# Patient Record
Sex: Female | Born: 1990 | Race: Black or African American | Hispanic: No | Marital: Married | Smoking: Never smoker
Health system: Southern US, Community
[De-identification: ages and names within clinical notes are randomized; demographics above are authoritative.]

## PROBLEM LIST (undated history)

## (undated) DIAGNOSIS — K5900 Constipation, unspecified: Secondary | ICD-10-CM

## (undated) DIAGNOSIS — D509 Iron deficiency anemia, unspecified: Secondary | ICD-10-CM

## (undated) DIAGNOSIS — D573 Sickle-cell trait: Secondary | ICD-10-CM

## (undated) DIAGNOSIS — K589 Irritable bowel syndrome without diarrhea: Secondary | ICD-10-CM

## (undated) HISTORY — DX: Constipation, unspecified: K59.00

## (undated) HISTORY — DX: Irritable bowel syndrome without diarrhea: K58.9

## (undated) HISTORY — DX: Iron deficiency anemia, unspecified: D50.9

---

## 2011-09-22 HISTORY — PX: WISDOM TOOTH EXTRACTION: SHX21

## 2014-09-21 DIAGNOSIS — K5900 Constipation, unspecified: Secondary | ICD-10-CM

## 2014-09-21 DIAGNOSIS — K589 Irritable bowel syndrome without diarrhea: Secondary | ICD-10-CM

## 2014-09-21 HISTORY — DX: Constipation, unspecified: K59.00

## 2014-09-21 HISTORY — DX: Irritable bowel syndrome without diarrhea: K58.9

## 2018-03-22 LAB — HM PAP SMEAR: HM Pap smear: NORMAL

## 2018-05-16 ENCOUNTER — Encounter: Payer: Self-pay | Admitting: Internal Medicine

## 2018-05-16 ENCOUNTER — Ambulatory Visit: Payer: Managed Care, Other (non HMO) | Admitting: Internal Medicine

## 2018-05-16 VITALS — BP 98/60 | HR 80 | Ht 66.0 in | Wt 125.0 lb

## 2018-05-16 DIAGNOSIS — N760 Acute vaginitis: Secondary | ICD-10-CM | POA: Diagnosis not present

## 2018-05-16 DIAGNOSIS — F419 Anxiety disorder, unspecified: Secondary | ICD-10-CM | POA: Diagnosis not present

## 2018-05-16 DIAGNOSIS — F329 Major depressive disorder, single episode, unspecified: Secondary | ICD-10-CM | POA: Diagnosis not present

## 2018-05-16 LAB — POCT WET PREP WITH KOH
KOH Prep POC: NEGATIVE
RBC Wet Prep HPF POC: 0
TRICHOMONAS UA: NEGATIVE
WBC Wet Prep HPF POC: 5

## 2018-05-16 MED ORDER — METRONIDAZOLE 500 MG PO TABS: 500.0000 mg | ORAL_TABLET | Freq: Two times a day (BID) | ORAL | 0 refills | Status: AC

## 2018-05-16 NOTE — Progress Notes (Signed)
Date:  05/16/2018   Name:  Helen Schneider   DOB:  06-30-1991   MRN:  [id number]   Chief Complaint: Establish Care; Depression (PHQ9- 10. Was in therapy since 2016 in [place]. Newly moved her three months ago after getting married.); and Vaginal Discharge (X 3 days. Itching, white discharge, burning when urinating. No swelling or inflammation. )  Depression         This is a recurrent problem.The problem is unchanged.  Associated symptoms include no fatigue, no headaches and no suicidal ideas.     The symptoms are aggravated by social issues and family issues.  Past treatments include psychotherapy.  Compliance with treatment is good.  Previous treatment provided moderate relief. Vaginal Discharge  The patient's primary symptoms include genital itching and vaginal discharge. This is a new problem. The current episode started in the past 7 days. The problem occurs constantly. The problem has been unchanged. Pertinent negatives include no back pain, chills, dysuria, fever, headaches or hematuria.      Review of Systems  Constitutional: Negative for chills, fatigue and fever.  Respiratory: Negative for chest tightness, shortness of breath and wheezing.   Cardiovascular: Negative for palpitations and leg swelling.  Genitourinary: Positive for vaginal discharge. Negative for difficulty urinating, dysuria, genital sores and hematuria.  Musculoskeletal: Negative for arthralgias and back pain.  Neurological: Negative for dizziness and headaches.  Psychiatric/Behavioral: Positive for depression and dysphoric mood. Negative for self-injury, sleep disturbance and suicidal ideas. The patient is nervous/anxious.     Patient Active Problem List   Diagnosis Date Noted  . Anxiety and depression 05/16/2018    No Known Allergies  History reviewed. No pertinent surgical history.  Social History   Tobacco Use  . Smoking status: Never Smoker  . Smokeless tobacco: Never Used  Substance Use Topics  .  Alcohol use: Never    Frequency: Never  . Drug use: Never     Medication list has been reviewed and updated.  Current Meds  Medication Sig  . Ascorbic Acid (VITAMIN C) 100 MG tablet Take 100 mg by mouth daily.  . ferrous sulfate 325 (65 FE) MG EC tablet Take 325 mg by mouth 3 (three) times daily with meals.  . folic acid (FOLVITE) 1 MG tablet Take 1 mg by mouth daily.  . norethindrone-ethinyl estradiol (JUNEL FE,GILDESS FE,LOESTRIN FE) 1-20 MG-MCG tablet Take 1 tablet by mouth daily.  . Probiotic Product (PROBIOTIC PO) Take by mouth.    PHQ 2/9 Scores 05/16/2018  PHQ - 2 Score 4  PHQ- 9 Score 10    Physical Exam  Constitutional: She is oriented to person, place, and time. She appears well-developed. No distress.  HENT:  Head: Normocephalic and atraumatic.  Neck: Normal range of motion. Neck supple.  Cardiovascular: Normal rate, regular rhythm and normal heart sounds.  Pulmonary/Chest: Effort normal and breath sounds normal. No respiratory distress.  Abdominal: Soft. Bowel sounds are normal. She exhibits no mass. There is no tenderness. There is no rebound and no guarding.  Musculoskeletal: Normal range of motion.  Lymphadenopathy:    She has no cervical adenopathy.  Neurological: She is alert and oriented to person, place, and time.  Skin: Skin is warm and dry. No rash noted.  Psychiatric: She has a normal mood and affect. Her behavior is normal. Thought content normal.  Nursing note and vitals reviewed.   BP 98/60 (BP Location: Right Arm, Patient Position: Sitting, Cuff Size: Normal)   Pulse 80   Ht 5'  6" (1.676 m)   Wt 125 lb (56.7 kg)   LMP 04/28/2018 (Exact Date)   SpO2 100%   BMI 20.18 kg/m   Assessment and Plan 1. Acute vaginitis - POCT Wet Prep with KOH - metroNIDAZOLE (FLAGYL) 500 MG tablet; Take 1 tablet (500 mg total) by mouth 2 (two) times daily for 5 days.  Dispense: 14 tablet; Refill: 0  2. Anxiety and depression Several recommendations for counseling  provided   Meds ordered this encounter  Medications  . metroNIDAZOLE (FLAGYL) 500 MG tablet    Sig: Take 1 tablet (500 mg total) by mouth 2 (two) times daily for 5 days.    Dispense:  14 tablet    Refill:  0    Partially dictated using Animal nutritionistDragon software. Any errors are unintentional.  Bari EdwardLaura Venice Liz, MD [facility]  05/16/2018

## 2018-05-16 NOTE — Patient Instructions (Addendum)
Dr. Caryn SectionAarti Kapur  [phone] (psychiatrist)  Esperanza RichtersBruce Thomson - counseling psychologist [phone]  Gevena Barreara H Jones (counselor) - card given

## 2018-06-13 ENCOUNTER — Encounter: Payer: Self-pay | Admitting: Internal Medicine

## 2018-06-14 ENCOUNTER — Ambulatory Visit (INDEPENDENT_AMBULATORY_CARE_PROVIDER_SITE_OTHER): Payer: Managed Care, Other (non HMO) | Admitting: Internal Medicine

## 2018-06-14 ENCOUNTER — Encounter: Payer: Self-pay | Admitting: Internal Medicine

## 2018-06-14 VITALS — BP 102/66 | HR 76 | Ht 66.0 in | Wt 122.0 lb

## 2018-06-14 DIAGNOSIS — N6011 Diffuse cystic mastopathy of right breast: Secondary | ICD-10-CM | POA: Diagnosis not present

## 2018-06-14 DIAGNOSIS — N926 Irregular menstruation, unspecified: Secondary | ICD-10-CM

## 2018-06-14 DIAGNOSIS — Z3041 Encounter for surveillance of contraceptive pills: Secondary | ICD-10-CM

## 2018-06-14 DIAGNOSIS — N6012 Diffuse cystic mastopathy of left breast: Secondary | ICD-10-CM

## 2018-06-14 DIAGNOSIS — N898 Other specified noninflammatory disorders of vagina: Secondary | ICD-10-CM

## 2018-06-14 LAB — POCT WET PREP WITH KOH
KOH PREP POC: NEGATIVE
TRICHOMONAS UA: NEGATIVE

## 2018-06-14 LAB — POCT URINALYSIS DIPSTICK
Bilirubin, UA: NEGATIVE
GLUCOSE UA: NEGATIVE
Ketones, UA: NEGATIVE
LEUKOCYTES UA: NEGATIVE
Nitrite, UA: NEGATIVE
PH UA: 6 (ref 5.0–8.0)
Protein, UA: NEGATIVE
RBC UA: NEGATIVE
SPEC GRAV UA: 1.015 (ref 1.010–1.025)
UROBILINOGEN UA: 0.2 U/dL

## 2018-06-14 LAB — POCT URINE PREGNANCY: PREG TEST UR: NEGATIVE

## 2018-06-14 NOTE — Progress Notes (Signed)
Date:  06/14/2018   Name:  Helen Schneider   DOB:  04/07/1991   MRN:  [id number]   Chief Complaint: Breast Pain (Both sides. Under armpir pain. Had two mentrual cycles this month and pain is lasting longer than normal.) and vaginal odor (Was given Flagyl at last visit and it did help. Itching and burning is gone but still has fishy odor. )  HPI  Bilateral breast tenderness - usually with her periods but more prolonged recently.  No nipple discharge or recent injury. She has had 2 periods this month.  The first was short - 3 days then last week had 4 days of regular bleeding then spotting since then. She has a vaginal odor but no itching or abnormal discharge.  Review of Systems  Constitutional: Negative for chills, fatigue and fever.  Respiratory: Negative for cough, chest tightness, shortness of breath and wheezing.   Cardiovascular: Negative for chest pain.  Gastrointestinal: Negative for abdominal pain, blood in stool and constipation.  Genitourinary: Positive for menstrual problem.  Musculoskeletal: Negative for arthralgias and back pain.  Neurological: Negative for dizziness and headaches.  Psychiatric/Behavioral: Negative for dysphoric mood. The patient is not nervous/anxious.     Patient Active Problem List   Diagnosis Date Noted  . Anxiety and depression 05/16/2018    No Known Allergies  History reviewed. No pertinent surgical history.  Social History   Tobacco Use  . Smoking status: Never Smoker  . Smokeless tobacco: Never Used  Substance Use Topics  . Alcohol use: Never    Frequency: Never  . Drug use: Never     Medication list has been reviewed and updated.  Current Meds  Medication Sig  . Ascorbic Acid (VITAMIN C) 100 MG tablet Take 100 mg by mouth daily.  . ferrous sulfate 325 (65 FE) MG EC tablet Take 325 mg by mouth 3 (three) times daily with meals.  . folic acid (FOLVITE) 1 MG tablet Take 1 mg by mouth daily.  . norethindrone-ethinyl estradiol  (JUNEL FE,GILDESS FE,LOESTRIN FE) 1-20 MG-MCG tablet Take 1 tablet by mouth daily.  . Probiotic Product (PROBIOTIC PO) Take by mouth.    PHQ 2/9 Scores 05/16/2018  PHQ - 2 Score 4  PHQ- 9 Score 10    Physical Exam  Constitutional: She is oriented to person, place, and time. She appears well-developed. No distress.  HENT:  Head: Normocephalic and atraumatic.  Cardiovascular: Normal rate, regular rhythm and normal heart sounds.  Pulmonary/Chest: Effort normal and breath sounds normal. No respiratory distress. Right breast exhibits tenderness. Right breast exhibits no inverted nipple, no mass, no nipple discharge and no skin change. Left breast exhibits tenderness. Left breast exhibits no inverted nipple, no mass, no nipple discharge and no skin change.  Tender both outer breasts, R>L Fibrocystic changes in both lateral breasts  Abdominal: Soft. Normal appearance. There is no tenderness.  Musculoskeletal: Normal range of motion.  Neurological: She is alert and oriented to person, place, and time.  Skin: Skin is warm and dry. No rash noted.  Psychiatric: She has a normal mood and affect. Her behavior is normal. Thought content normal.  Nursing note and vitals reviewed.   BP 102/66 (BP Location: Right Arm, Patient Position: Sitting, Cuff Size: Normal)   Pulse 76   Ht 5\' 6"  (1.676 m)   Wt 122 lb (55.3 kg)   LMP 06/07/2018 (Exact Date)   SpO2 100%   BMI 19.69 kg/m   Assessment and Plan: 1. Fibrocystic breast changes of  both breasts Avoid caffeine, begin vitamin E If fullness persists after next menstrual cycle, will order US  2. Irregular menses Likely due to initiation of OCPs - POCT urine pregnancy  3. Oral contraceptive use Continue ocps daily Cycle should even out and become lighter and regular over the next 1-2 months  4. Vaginal odor No evidence of infection seen - POCT urinalysis dipstick - POCT Wet Prep with KOH   Partially dictated using Animal nutritionistDragon software. Any  errors are unintentional.  Bari EdwardLaura Lemond Griffee, MD [facility]  06/14/2018

## 2018-06-14 NOTE — Patient Instructions (Signed)
Wait one more month on birth control pills to see if cycle regulates.  Wait 1-2 weeks while monitoring breast issues - if worsening or not improved, call for breast US.  Limit caffeine and take vitamin E tablet daily; advil or tylenol if breast pain is severe

## 2018-06-15 ENCOUNTER — Ambulatory Visit: Payer: Managed Care, Other (non HMO) | Admitting: Internal Medicine

## 2018-09-07 ENCOUNTER — Ambulatory Visit (INDEPENDENT_AMBULATORY_CARE_PROVIDER_SITE_OTHER): Payer: Self-pay | Admitting: Internal Medicine

## 2018-09-07 ENCOUNTER — Encounter: Payer: Self-pay | Admitting: Internal Medicine

## 2018-09-07 VITALS — BP 112/70 | HR 75 | Ht 66.0 in | Wt 123.0 lb

## 2018-09-07 DIAGNOSIS — N949 Unspecified condition associated with female genital organs and menstrual cycle: Secondary | ICD-10-CM

## 2018-09-07 LAB — POCT URINALYSIS DIPSTICK
Bilirubin, UA: NEGATIVE
Blood, UA: NEGATIVE
GLUCOSE UA: NEGATIVE
KETONES UA: NEGATIVE
Leukocytes, UA: NEGATIVE
NITRITE UA: NEGATIVE
Protein, UA: NEGATIVE
SPEC GRAV UA: 1.02 (ref 1.010–1.025)
Urobilinogen, UA: 0.2 E.U./dL
pH, UA: 6 (ref 5.0–8.0)

## 2018-09-07 LAB — POCT WET PREP WITH KOH
Clue Cells Wet Prep HPF POC: POSITIVE
KOH PREP POC: NEGATIVE
Trichomonas, UA: NEGATIVE
WBC WET PREP PER HPF POC: 10

## 2018-09-07 MED ORDER — METRONIDAZOLE 0.75 % VA GEL: 1.0000 | Freq: Two times a day (BID) | VAGINAL | 0 refills | Status: DC

## 2018-09-07 NOTE — Progress Notes (Signed)
Date:  09/07/2018   Name:  Helen Schneider   DOB:  [birth year]   MRN:  [id number]   Chief Complaint: Vaginal Itching (Started last Friday. Itching stopped two days ago. When having intercourse with husband vagina is burning. Burning when urination, and strong fishy odor.  )  Vaginal Itching  The patient's primary symptoms include genital itching, a genital odor and vaginal bleeding. The patient's pertinent negatives include no pelvic pain or vaginal discharge. The current episode started in the past 7 days. The problem occurs constantly. The problem has been unchanged. The pain is mild. She is not pregnant. Associated symptoms include dysuria. Pertinent negatives include no abdominal pain, chills, diarrhea, fever, headaches, hematuria or nausea. She is sexually active. No, her partner does not have an STD. She uses oral contraceptives for contraception.    Review of Systems  Constitutional: Negative for chills, fatigue and fever.  Respiratory: Negative for chest tightness and shortness of breath.   Cardiovascular: Negative for chest pain and palpitations.  Gastrointestinal: Negative for abdominal pain, diarrhea and nausea.  Genitourinary: Positive for dysuria and menstrual problem (menses have been longer but lighter since starting OCPs). Negative for genital sores, hematuria, pelvic pain and vaginal discharge.  Neurological: Negative for dizziness and headaches.    Patient Active Problem List   Diagnosis Date Noted  . Anxiety and depression 05/16/2018    No Known Allergies  History reviewed. No pertinent surgical history.  Social History   Tobacco Use  . Smoking status: Never Smoker  . Smokeless tobacco: Never Used  Substance Use Topics  . Alcohol use: Never    Frequency: Never  . Drug use: Never     Medication list has been reviewed and updated.  Current Meds  Medication Sig  . Ascorbic Acid (VITAMIN C) 100 MG tablet Take 100 mg by mouth daily.  . ferrous sulfate 325  (65 FE) MG EC tablet Take 325 mg by mouth 3 (three) times daily with meals.  . folic acid (FOLVITE) 1 MG tablet Take 1 mg by mouth daily.  . norethindrone-ethinyl estradiol (JUNEL FE,GILDESS FE,LOESTRIN FE) 1-20 MG-MCG tablet Take 1 tablet by mouth daily.  . Probiotic Product (PROBIOTIC PO) Take by mouth.    PHQ 2/9 Scores 05/16/2018  PHQ - 2 Score 4  PHQ- 9 Score 10    Physical Exam Vitals signs and nursing note reviewed.  Constitutional:      General: She is not in acute distress.    Appearance: She is well-developed.  HENT:     Head: Normocephalic and atraumatic.  Cardiovascular:     Rate and Rhythm: Normal rate and regular rhythm.  Pulmonary:     Effort: Pulmonary effort is normal. No respiratory distress.     Breath sounds: Normal breath sounds.  Abdominal:     General: Abdomen is flat. Bowel sounds are normal.     Palpations: Abdomen is soft.     Tenderness: There is no abdominal tenderness.  Musculoskeletal: Normal range of motion.  Skin:    General: Skin is warm and dry.     Findings: No rash.  Neurological:     Mental Status: She is alert and oriented to person, place, and time.  Psychiatric:        Behavior: Behavior normal.        Thought Content: Thought content normal.     BP 112/70 (BP Location: Right Arm, Patient Position: Sitting, Cuff Size: Normal)   Pulse 75   Ht 5\' 6"  (  1.676 m)   Wt 123 lb (55.8 kg)   SpO2 100%   BMI 19.85 kg/m   Assessment and Plan: 1. Vaginal burning - POCT Urinalysis Dipstick - POCT Wet Prep with KOH - metroNIDAZOLE (METROGEL) 0.75 % vaginal gel; Place 1 Applicatorful vaginally 2 (two) times daily for 5 days.  Dispense: 100 g; Refill: 0   Partially dictated using Animal nutritionistDragon software. Any errors are unintentional.  Bari EdwardLaura Colinda Barth, MD [facility]  09/07/2018

## 2018-09-23 ENCOUNTER — Other Ambulatory Visit: Payer: Self-pay | Admitting: Internal Medicine

## 2018-09-23 DIAGNOSIS — N949 Unspecified condition associated with female genital organs and menstrual cycle: Secondary | ICD-10-CM

## 2018-09-23 MED ORDER — METRONIDAZOLE 0.75 % VA GEL: 1.0000 | Freq: Two times a day (BID) | VAGINAL | 0 refills | Status: AC

## 2018-10-10 ENCOUNTER — Ambulatory Visit: Payer: Self-pay | Admitting: Internal Medicine

## 2018-10-12 ENCOUNTER — Other Ambulatory Visit: Payer: Self-pay

## 2018-10-12 ENCOUNTER — Ambulatory Visit: Payer: BC Managed Care – PPO | Admitting: Internal Medicine

## 2018-10-12 ENCOUNTER — Encounter: Payer: Self-pay | Admitting: Internal Medicine

## 2018-10-12 VITALS — BP 118/70 | HR 77 | Ht 66.0 in | Wt 122.0 lb

## 2018-10-12 DIAGNOSIS — Z789 Other specified health status: Secondary | ICD-10-CM | POA: Diagnosis not present

## 2018-10-12 MED ORDER — NORETHIN ACE-ETH ESTRAD-FE 1-20 MG-MCG(24) PO TABS: 1.0000 | ORAL_TABLET | Freq: Every day | ORAL | 11 refills | Status: DC

## 2018-10-12 MED ORDER — NORETHIN ACE-ETH ESTRAD-FE 1-20 MG-MCG PO TABS: 1.0000 | ORAL_TABLET | Freq: Every day | ORAL | 5 refills | Status: DC

## 2018-10-12 MED ORDER — NORETHIN-ETH ESTRAD-FE BIPHAS 1 MG-10 MCG / 10 MCG PO TABS: 1.0000 | ORAL_TABLET | Freq: Every day | ORAL | 11 refills | Status: DC

## 2018-10-12 NOTE — Progress Notes (Signed)
    Date:  10/12/2018   Name:  Helen Schneider   DOB:  09-Aug-1991   MRN:  [id number]   Chief Complaint: No chief complaint on file. Contraception - has been on Lo Loestrin for 6 months and doing well but needs a refill.  New Rx was sent but it is not covered and would require a PA.   Menses are regular, no headaches, nausea, lower extremity edema. Pas was normal last year. She has no concerns for STIs. HPI  Review of Systems  Constitutional: Negative for chills, fatigue and fever.  Respiratory: Negative for chest tightness and shortness of breath.   Cardiovascular: Negative for chest pain and leg swelling.  Genitourinary: Negative for menstrual problem.  Neurological: Negative for dizziness and headaches.    Patient Active Problem List   Diagnosis Date Noted  . Anxiety and depression 05/16/2018    No Known Allergies  History reviewed. No pertinent surgical history.  Social History   Tobacco Use  . Smoking status: Never Smoker  . Smokeless tobacco: Never Used  Substance Use Topics  . Alcohol use: Never    Frequency: Never  . Drug use: Never     Medication list has been reviewed and updated.  Current Meds  Medication Sig  . Ascorbic Acid (VITAMIN C) 100 MG tablet Take 100 mg by mouth daily.  . ferrous sulfate 325 (65 FE) MG EC tablet Take 325 mg by mouth 3 (three) times daily with meals.  . folic acid (FOLVITE) 1 MG tablet Take 1 mg by mouth daily.  . Probiotic Product (PROBIOTIC PO) Take by mouth.  . [DISCONTINUED] norethindrone-ethinyl estradiol (JUNEL FE,GILDESS FE,LOESTRIN FE) 1-20 MG-MCG tablet Take 1 tablet by mouth daily.    PHQ 2/9 Scores 05/16/2018  PHQ - 2 Score 4  PHQ- 9 Score 10    Physical Exam Vitals signs and nursing note reviewed.  Constitutional:      General: She is not in acute distress.    Appearance: She is well-developed.  HENT:     Head: Normocephalic and atraumatic.  Neck:     Musculoskeletal: Normal range of motion.  Cardiovascular:     Rate and Rhythm: Normal rate and regular rhythm.  Pulmonary:     Effort: Pulmonary effort is normal. No respiratory distress.     Breath sounds: Normal breath sounds.  Musculoskeletal: Normal range of motion.     Right lower leg: No edema.     Left lower leg: No edema.  Skin:    General: Skin is warm and dry.     Findings: No rash.  Neurological:     Mental Status: She is alert and oriented to person, place, and time.  Psychiatric:        Behavior: Behavior normal.        Thought Content: Thought content normal.     BP 118/70   Pulse 77   Ht 5\' 6"  (1.676 m)   Wt 122 lb (55.3 kg)   SpO2 100%   BMI 19.69 kg/m   Assessment and Plan: 1. Uses oral contraceptives as primary birth control method Lo Loestrin not covered - will change to Loestrin Fe generic - Norethindrone Acetate-Ethinyl Estrad-FE (LOESTRIN 24 FE) 1-20 MG-MCG(24) tablet; Take 1 tablet by mouth daily.  Dispense: 1 Package; Refill: 11   Partially dictated using Animal nutritionist. Any errors are unintentional.  Bari Edward, MD [facility]  10/12/2018

## 2019-05-10 ENCOUNTER — Ambulatory Visit: Payer: BC Managed Care – PPO | Admitting: Internal Medicine

## 2019-05-17 ENCOUNTER — Ambulatory Visit (INDEPENDENT_AMBULATORY_CARE_PROVIDER_SITE_OTHER): Payer: BC Managed Care – PPO | Admitting: Internal Medicine

## 2019-05-17 ENCOUNTER — Encounter: Payer: Self-pay | Admitting: Internal Medicine

## 2019-05-17 ENCOUNTER — Other Ambulatory Visit: Payer: Self-pay

## 2019-05-17 VITALS — BP 99/60 | HR 88 | Ht 66.0 in | Wt 121.0 lb

## 2019-05-17 DIAGNOSIS — R42 Dizziness and giddiness: Secondary | ICD-10-CM | POA: Diagnosis not present

## 2019-05-17 MED ORDER — MECLIZINE HCL 12.5 MG PO TABS: 12.5000 mg | ORAL_TABLET | Freq: Three times a day (TID) | ORAL | 0 refills | Status: DC | PRN

## 2019-05-17 NOTE — Progress Notes (Signed)
Date:  05/17/2019   Name:  Helen Schneider   DOB:  Jan 29, 1991   MRN:  [id number]   Chief Complaint: Dizziness (Last week was having dizzy spells all day. Have not had any today but still had it yesterday. Had vertigo in the past. )  Dizziness This is a new problem. The current episode started in the past 7 days. The problem occurs daily. The problem has been gradually improving. Pertinent negatives include no coughing, diaphoresis, fever, headaches, nausea, sore throat, visual change, vomiting or weakness.    Review of Systems  Constitutional: Negative for diaphoresis and fever.  HENT: Positive for ear pain (left sided pain resolved). Negative for sore throat.   Eyes: Negative for visual disturbance.  Respiratory: Negative for cough, chest tightness and shortness of breath.   Cardiovascular: Negative for palpitations and leg swelling.  Gastrointestinal: Negative for nausea and vomiting.  Allergic/Immunologic: Negative for environmental allergies.  Neurological: Positive for dizziness. Negative for syncope, weakness and headaches.    Patient Active Problem List   Diagnosis Date Noted  . Anxiety and depression 05/16/2018    No Known Allergies  History reviewed. No pertinent surgical history.  Social History   Tobacco Use  . Smoking status: Never Smoker  . Smokeless tobacco: Never Used  Substance Use Topics  . Alcohol use: Never    Frequency: Never  . Drug use: Never     Medication list has been reviewed and updated.  Current Meds  Medication Sig  . Ascorbic Acid (VITAMIN C) 100 MG tablet Take 100 mg by mouth daily.  . ferrous sulfate 325 (65 FE) MG EC tablet Take 325 mg by mouth 3 (three) times daily with meals.  . folic acid (FOLVITE) 1 MG tablet Take 1 mg by mouth daily.  . Norethindrone Acetate-Ethinyl Estrad-FE (LOESTRIN 24 FE) 1-20 MG-MCG(24) tablet Take 1 tablet by mouth daily.  . Probiotic Product (PROBIOTIC PO) Take by mouth.    PHQ 2/9 Scores 05/17/2019  05/17/2019 05/16/2018  PHQ - 2 Score 1 1 4   PHQ- 9 Score 1 - 10    BP Readings from Last 3 Encounters:  05/17/19 99/60  10/12/18 118/70  09/07/18 112/70    Physical Exam Vitals signs and nursing note reviewed.  Constitutional:      General: She is not in acute distress.    Appearance: Normal appearance. She is well-developed.  HENT:     Head: Normocephalic and atraumatic.     Right Ear: Tympanic membrane and ear canal normal.     Left Ear: Tympanic membrane and ear canal normal.  Eyes:     General: Lids are normal.     Extraocular Movements:     Right eye: Nystagmus present.     Left eye: Nystagmus present.     Conjunctiva/sclera: Conjunctivae normal.  Neck:     Musculoskeletal: Normal range of motion.  Cardiovascular:     Rate and Rhythm: Normal rate and regular rhythm.  Pulmonary:     Effort: Pulmonary effort is normal. No respiratory distress.     Breath sounds: No wheezing or rhonchi.  Musculoskeletal: Normal range of motion.  Lymphadenopathy:     Cervical: No cervical adenopathy.  Skin:    General: Skin is warm and dry.     Findings: No rash.  Neurological:     Mental Status: She is alert and oriented to person, place, and time.     Sensory: Sensation is intact.     Motor: Motor function is intact.  Coordination: Coordination is intact.     Gait: Gait is intact.  Psychiatric:        Behavior: Behavior normal.        Thought Content: Thought content normal.     Wt Readings from Last 3 Encounters:  05/17/19 121 lb (54.9 kg)  10/12/18 122 lb (55.3 kg)  09/07/18 123 lb (55.8 kg)    BP 99/60   Pulse 88   Ht 5\' 6"  (1.676 m)   Wt 121 lb (54.9 kg)   LMP 04/28/2019 (Exact Date)   SpO2 99%   BMI 19.53 kg/m   Assessment and Plan: 1. Vertigo Probably secondary to recent URI and ear congestion Expect sx to gradually decrease then stop over the next few days May use meclizine if sx are severe and pt does not need to drive - meclizine (ANTIVERT) 12.5 MG  tablet; Take 1 tablet (12.5 mg total) by mouth 3 (three) times daily as needed for dizziness.  Dispense: 20 tablet; Refill: 0   Partially dictated using Animal nutritionistDragon software. Any errors are unintentional.  Bari EdwardLaura Sotirios Navarro, MD [facility]  05/17/2019

## 2019-07-06 ENCOUNTER — Ambulatory Visit (INDEPENDENT_AMBULATORY_CARE_PROVIDER_SITE_OTHER): Payer: BC Managed Care – PPO | Admitting: Internal Medicine

## 2019-07-06 ENCOUNTER — Encounter: Payer: Self-pay | Admitting: Internal Medicine

## 2019-07-06 VITALS — Temp 97.2°F | Ht 66.0 in | Wt 121.0 lb

## 2019-07-06 DIAGNOSIS — J019 Acute sinusitis, unspecified: Secondary | ICD-10-CM | POA: Diagnosis not present

## 2019-07-06 MED ORDER — AMOXICILLIN-POT CLAVULANATE 875-125 MG PO TABS: 1.0000 | ORAL_TABLET | Freq: Two times a day (BID) | ORAL | 0 refills | Status: AC

## 2019-07-06 NOTE — Progress Notes (Signed)
Date:  07/06/2019   Name:  Helen Schneider   DOB:  November 24, 1990   MRN:  [id number]  I connected with this patient, Helen Schneider Resides, by telephone at the patient's work.  I verified that I am speaking with the correct person using two identifiers. This visit was conducted via telephone due to the Covid-19 outbreak from my office at [facility] in [place], [place]. I discussed the limitations, risks, security and privacy concerns of performing an evaluation and management service by telephone. I also discussed with the patient that there may be a patient responsible charge related to this service. The patient expressed understanding and agreed to proceed.  Chief Complaint: Sinusitis (Started Monday afternoon. Scratchy throat, swollen lymph nodes in neck, sinus pressure. Coughing with yellow production. No fever. Pressure around nose, and cheek bones. )  Sinusitis This is a new problem. The current episode started in the past 7 days. The problem has been gradually worsening since onset. There has been no fever. The pain is mild. Associated symptoms include congestion, coughing, headaches, sinus pressure, sneezing and a sore throat. Pertinent negatives include no chills or shortness of breath. Treatments tried: mucinex. The treatment provided no relief.    Review of Systems  Constitutional: Negative for chills, fatigue and fever.  HENT: Positive for congestion, postnasal drip, sinus pressure, sneezing and sore throat. Negative for tinnitus and trouble swallowing.   Respiratory: Positive for cough. Negative for chest tightness, shortness of breath and wheezing.   Cardiovascular: Negative for chest pain and palpitations.  Gastrointestinal: Negative for diarrhea and vomiting.  Skin: Negative for rash.  Allergic/Immunologic: Negative for environmental allergies.  Neurological: Positive for headaches. Negative for dizziness.    Patient Active Problem List   Diagnosis Date Noted  . Anxiety and  depression 05/16/2018    No Known Allergies  History reviewed. No pertinent surgical history.  Social History   Tobacco Use  . Smoking status: Never Smoker  . Smokeless tobacco: Never Used  Substance Use Topics  . Alcohol use: Never    Frequency: Never  . Drug use: Never     Medication list has been reviewed and updated.  Current Meds  Medication Sig  . Ascorbic Acid (VITAMIN C) 100 MG tablet Take 100 mg by mouth daily.  . ferrous sulfate 325 (65 FE) MG EC tablet Take 325 mg by mouth 3 (three) times daily with meals.  . folic acid (FOLVITE) 1 MG tablet Take 1 mg by mouth daily.  . meclizine (ANTIVERT) 12.5 MG tablet Take 1 tablet (12.5 mg total) by mouth 3 (three) times daily as needed for dizziness.  . Probiotic Product (PROBIOTIC PO) Take by mouth.  . [DISCONTINUED] Norethindrone Acetate-Ethinyl Estrad-FE (LOESTRIN 24 FE) 1-20 MG-MCG(24) tablet Take 1 tablet by mouth daily.    PHQ 2/9 Scores 07/06/2019 05/17/2019 05/17/2019 05/16/2018  PHQ - 2 Score 0 1 1 4   PHQ- 9 Score 0 1 - 10    BP Readings from Last 3 Encounters:  05/17/19 99/60  10/12/18 118/70  09/07/18 112/70    Physical Exam Pulmonary:     Comments: No cough or dyspnea noted during the call Neurological:     Mental Status: She is alert.  Psychiatric:        Attention and Perception: Attention normal.        Mood and Affect: Mood normal.        Speech: Speech normal.        Cognition and Memory: Cognition normal.  Wt Readings from Last 3 Encounters:  07/06/19 121 lb (54.9 kg)  05/17/19 121 lb (54.9 kg)  10/12/18 122 lb (55.3 kg)    Temp (!) 97.2 F (36.2 C) (Oral)   Ht 5\' 6"  (1.676 m)   Wt 121 lb (54.9 kg)   BMI 19.53 kg/m   Assessment and Plan: 1. Acute non-recurrent sinusitis, unspecified location Stop mucinex  Begin Claritin-D Proceed with Covid test previously arranged - amoxicillin-clavulanate (AUGMENTIN) 875-125 MG tablet; Take 1 tablet by mouth 2 (two) times daily for 10 days.   Dispense: 20 tablet; Refill: 0  I spent 6 minutes on this encounter. Partially dictated using . Any errors are unintentional.  Animal nutritionist, MD [facility]  07/06/2019

## 2019-07-19 ENCOUNTER — Other Ambulatory Visit: Payer: Self-pay | Admitting: Internal Medicine

## 2019-07-19 DIAGNOSIS — B3731 Acute candidiasis of vulva and vagina: Secondary | ICD-10-CM

## 2019-07-19 DIAGNOSIS — B373 Candidiasis of vulva and vagina: Secondary | ICD-10-CM

## 2019-07-19 MED ORDER — FLUCONAZOLE 100 MG PO TABS: 100.0000 mg | ORAL_TABLET | Freq: Once | ORAL | 0 refills | Status: AC

## 2019-09-19 ENCOUNTER — Encounter: Payer: Self-pay | Admitting: Internal Medicine

## 2019-09-19 ENCOUNTER — Ambulatory Visit: Payer: BC Managed Care – PPO | Admitting: Internal Medicine

## 2019-09-19 ENCOUNTER — Other Ambulatory Visit: Payer: Self-pay

## 2019-09-19 VITALS — BP 116/77 | HR 81 | Resp 16 | Ht 66.0 in | Wt 121.0 lb

## 2019-09-19 DIAGNOSIS — N912 Amenorrhea, unspecified: Secondary | ICD-10-CM

## 2019-09-19 LAB — POCT URINE PREGNANCY: Preg Test, Ur: POSITIVE — AB

## 2019-09-19 NOTE — Patient Instructions (Addendum)
[facility]  [facility]  Folic acid 1 mg per day  Tylenol, Sudafed, Claritin Stay away from combination cold/sinus medications For heartburn - Rolaids, Tums, liquid antiacids - avoid Pepcid and Prilosec  Estimated due date Sept 3, 2020  Commonly Asked Questions During Pregnancy  Cats: A parasite can be excreted in cat feces.  To avoid exposure you need to have another person empty the little box.  If you must empty the litter box you will need to wear gloves.  Wash your hands after handling your cat.  This parasite can also be found in raw or undercooked meat so this should also be avoided.  Colds, Sore Throats, Flu: Please check your medication sheet to see what you can take for symptoms.  If your symptoms are unrelieved by these medications please call the office.  Dental Work: Most any dental work Investment banker, corporate recommends is permitted.  X-rays should only be taken during the first trimester if absolutely necessary.  Your abdomen should be shielded with a lead apron during all x-rays.  Please notify your provider prior to receiving any x-rays.  Novocaine is fine; gas is not recommended.  If your dentist requires a note from [place] prior to dental work please call the office and we will provide one for you.  Exercise: Exercise is an important part of staying healthy during your pregnancy.  You may continue most exercises you were accustomed to prior to pregnancy.  Later in your pregnancy you will most likely notice you have difficulty with activities requiring balance like riding a bicycle.  It is important that you listen to your body and avoid activities that put you at a higher risk of falling.  Adequate rest and staying well hydrated are a must!  If you have questions about the safety of specific activities ask your provider.    Exposure to Children with illness: Try to avoid obvious exposure; report any symptoms to [place] when noted,  If you have chicken pos, red measles or mumps,  you should be immune to these diseases.   Please do not take any vaccines while pregnant unless you have checked with your OB provider.  Fetal Movement: After 28 weeks we recommend you do "kick counts" twice daily.  Lie or sit down in a calm quiet environment and count your baby movements "kicks".  You should feel your baby at least 10 times per hour.  If you have not felt 10 kicks within the first hour get up, walk around and have something sweet to eat or drink then repeat for an additional hour.  If count remains less than 10 per hour notify your provider.  Fumigating: Follow your pest control agent's advice as to how long to stay out of your home.  Ventilate the area well before re-entering.  Hemorrhoids:   Most over-the-counter preparations can be used during pregnancy.  Check your medication to see what is safe to use.  It is important to use a stool softener or fiber in your diet and to drink lots of liquids.  If hemorrhoids seem to be getting worse please call the office.   Hot Tubs:  Hot tubs Jacuzzis and saunas are not recommended while pregnant.  These increase your internal body temperature and should be avoided.  Intercourse:  Sexual intercourse is safe during pregnancy as long as you are comfortable, unless otherwise advised by your provider.  Spotting may occur after intercourse; report any bright red bleeding that is heavier than spotting.  Labor:  If you know that you are in labor, please go to the hospital.  If you are unsure, please call the office and let us help you decide what to do.  Lifting, straining, etc:  If your job requires heavy lifting or straining please check with your provider for any limitations.  Generally, you should not lift items heavier than that you can lift simply with your hands and arms (no back muscles)  Painting:  Paint fumes do not harm your pregnancy, but may make you ill and should be avoided if possible.  Latex or water based paints have less odor  than oils.  Use adequate ventilation while painting.  Permanents & Hair Color:  Chemicals in hair dyes are not recommended as they cause increase hair dryness which can increase hair loss during pregnancy.  " Highlighting" and permanents are allowed.  Dye may be absorbed differently and permanents may not hold as well during pregnancy.  Sunbathing:  Use a sunscreen, as skin burns easily during pregnancy.  Drink plenty of fluids; avoid over heating.  Tanning Beds:  Because their possible side effects are still unknown, tanning beds are not recommended.  Ultrasound Scans:  Routine ultrasounds are performed at approximately 20 weeks.  You will be able to see your baby's general anatomy an if you would like to know the gender this can usually be determined as well.  If it is questionable when you conceived you may also receive an ultrasound early in your pregnancy for dating purposes.  Otherwise ultrasound exams are not routinely performed unless there is a medical necessity.  Although you can request a scan we ask that you pay for it when conducted because insurance does not cover " patient request" scans.  Work: If your pregnancy proceeds without complications you may work until your due date, unless your physician or employer advises otherwise.  Round Ligament Pain/Pelvic Discomfort:  Sharp, shooting pains not associated with bleeding are fairly common, usually occurring in the second trimester of pregnancy.  They tend to be worse when standing up or when you remain standing for long periods of time.  These are the result of pressure of certain pelvic ligaments called "round ligaments".  Rest, Tylenol and heat seem to be the most effective relief.  As the womb and fetus grow, they rise out of the pelvis and the discomfort improves.  Please notify the office if your pain seems different than that described.  It may represent a more serious condition.

## 2019-09-19 NOTE — Progress Notes (Signed)
Date:  09/19/2019   Name:  Helen Schneider   DOB:  08-10-1991   MRN:  [id number]   Chief Complaint: Possible Pregnancy (LMP 08/20/2019) Patient has been trying to conceive since October.  Two days ago she felt some mild abdominal cramping but had no bleeding or other pain.  She has done three home pregnancy tests since then - all positive. She has had no more cramping and no bleeding.  She feels well, no nausea or breast tenderness. She was previously taking folic acid but ran out and has not restarted. She does not have a preference of OB.  HPI  No results found for: CREATININE, BUN, NA, K, CL, CO2 No results found for: CHOL, HDL, LDLCALC, LDLDIRECT, TRIG, CHOLHDL No results found for: TSH No results found for: HGBA1C   Review of Systems  Constitutional: Negative for chills, fatigue and fever.  Respiratory: Negative for chest tightness and shortness of breath.   Cardiovascular: Negative for chest pain, palpitations and leg swelling.  Gastrointestinal: Negative for abdominal pain, constipation, diarrhea, nausea and vomiting.  Genitourinary: Negative for dysuria and pelvic pain.  Neurological: Positive for headaches (occasional headached). Negative for dizziness and weakness.  Hematological: Negative for adenopathy.    Patient Active Problem List   Diagnosis Date Noted  . Anxiety and depression 05/16/2018    No Known Allergies  History reviewed. No pertinent surgical history.  Social History   Tobacco Use  . Smoking status: Never Smoker  . Smokeless tobacco: Never Used  Substance Use Topics  . Alcohol use: Never  . Drug use: Never     Medication list has been reviewed and updated.  Current Meds  Medication Sig  . Ascorbic Acid (VITAMIN C) 100 MG tablet Take 100 mg by mouth daily.  . ferrous sulfate 325 (65 FE) MG EC tablet Take 325 mg by mouth 3 (three) times daily with meals.  . folic acid (FOLVITE) 1 MG tablet Take 1 mg by mouth daily.  . meclizine (ANTIVERT)  12.5 MG tablet Take 1 tablet (12.5 mg total) by mouth 3 (three) times daily as needed for dizziness.  . Probiotic Product (PROBIOTIC PO) Take by mouth.    PHQ 2/9 Scores 07/06/2019 05/17/2019 05/17/2019 05/16/2018  PHQ - 2 Score 0 1 1 4   PHQ- 9 Score 0 1 - 10    BP Readings from Last 3 Encounters:  09/19/19 116/77  05/17/19 99/60  10/12/18 118/70    Physical Exam Vitals and nursing note reviewed.  Constitutional:      General: She is not in acute distress.    Appearance: Normal appearance. She is well-developed.  HENT:     Head: Normocephalic and atraumatic.  Cardiovascular:     Rate and Rhythm: Normal rate and regular rhythm.     Pulses: Normal pulses.  Pulmonary:     Effort: Pulmonary effort is normal. No respiratory distress.     Breath sounds: No wheezing or rhonchi.  Musculoskeletal:     Cervical back: Normal range of motion.     Right lower leg: No edema.     Left lower leg: No edema.  Lymphadenopathy:     Cervical: No cervical adenopathy.  Skin:    General: Skin is warm and dry.     Capillary Refill: Capillary refill takes less than 2 seconds.     Findings: No rash.  Neurological:     General: No focal deficit present.     Mental Status: She is alert and oriented to  person, place, and time.  Psychiatric:        Behavior: Behavior normal.        Thought Content: Thought content normal.     Wt Readings from Last 3 Encounters:  09/19/19 121 lb (54.9 kg)  07/06/19 121 lb (54.9 kg)  05/17/19 121 lb (54.9 kg)    BP 116/77   Pulse 81   Resp 16   Ht 5\' 6"  (1.676 m)   Wt 121 lb (54.9 kg)   LMP 08/20/2019 (Exact Date)   SpO2 100%   BMI 19.53 kg/m   Assessment and Plan: 1. Amenorrhea Secondary to pregnancy Patient to begin Folic acid 1 mg per day - or Prenatal vitamin daily Precautions given regarding over the counter medications, alcohol and tobacco use and caffeine intake. Establish with OB of choice for ongoing care - POCT urine  pregnancy   Partially dictated using Dragon software. Any errors are unintentional.  Halina Maidens, MD [facility]  09/19/2019  .

## 2019-09-21 ENCOUNTER — Telehealth: Payer: Self-pay

## 2019-09-21 NOTE — Telephone Encounter (Signed)
Received fax from [employer] for [place] to complete a PA and fill out form for pt to get a free breast pump. Called and informed patient she would get this completed from OBGYN once she is a littler farther along. She verbalized understanding.  Benedict Needy, CMA

## 2019-09-26 ENCOUNTER — Telehealth: Payer: Self-pay | Admitting: Obstetrics and Gynecology

## 2019-09-26 NOTE — Telephone Encounter (Signed)
Pt called. Pt stated that she noticed light spotting once yesterday and have not seen it since. Pt stated also having light cramping. Pt was advised to take tylenol for the pain and if she noticed heavy bleeding like a cycle to call the office or go to the ED. Pt stated that she understood.

## 2019-09-26 NOTE — Telephone Encounter (Signed)
Pt called in and stated that her PCP conformed her preg. On 12/29. Pt wants a call from the nurse she has spotting. Please advise

## 2019-10-20 NOTE — Patient Instructions (Signed)
First Trimester of Pregnancy The first trimester of pregnancy is from week 1 until the end of week 13 (months 1 through 3). A week after a sperm fertilizes an egg, the egg will implant on the wall of the uterus. This embryo will begin to develop into a baby. Genes from you and your partner will form the baby. The female genes will determine whether the baby will be a boy or a girl. At 6-8 weeks, the eyes and face will be formed, and the heartbeat can be seen on ultrasound. At the end of 12 weeks, all the baby's organs will be formed. Now that you are pregnant, you will want to do everything you can to have a healthy baby. Two of the most important things are to get good prenatal care and to follow your health care provider's instructions. Prenatal care is all the medical care you receive before the baby's birth. This care will help prevent, find, and treat any problems during the pregnancy and childbirth. Body changes during your first trimester Your body goes through many changes during pregnancy. The changes vary from woman to woman.  You may gain or lose a couple of pounds at first.  You may feel sick to your stomach (nauseous) and you may throw up (vomit). If the vomiting is uncontrollable, call your health care provider.  You may tire easily.  You may develop headaches that can be relieved by medicines. All medicines should be approved by your health care provider.  You may urinate more often. Painful urination may mean you have a bladder infection.  You may develop heartburn as a result of your pregnancy.  You may develop constipation because certain hormones are causing the muscles that push stool through your intestines to slow down.  You may develop hemorrhoids or swollen veins (varicose veins).  Your breasts may begin to grow larger and become tender. Your nipples may stick out more, and the tissue that surrounds them (areola) may become darker.  Your gums may bleed and may be  sensitive to brushing and flossing.  Dark spots or blotches (chloasma, mask of pregnancy) may develop on your face. This will likely fade after the baby is born.  Your menstrual periods will stop.  You may have a loss of appetite.  You may develop cravings for certain kinds of food.  You may have changes in your emotions from day to day, such as being excited to be pregnant or being concerned that something may go wrong with the pregnancy and baby.  You may have more vivid and strange dreams.  You may have changes in your hair. These can include thickening of your hair, rapid growth, and changes in texture. Some women also have hair loss during or after pregnancy, or hair that feels dry or thin. Your hair will most likely return to normal after your baby is born. What to expect at prenatal visits During a routine prenatal visit:  You will be weighed to make sure you and the baby are growing normally.  Your blood pressure will be taken.  Your abdomen will be measured to track your baby's growth.  The fetal heartbeat will be listened to between weeks 10 and 14 of your pregnancy.  Test results from any previous visits will be discussed. Your health care provider may ask you:  How you are feeling.  If you are feeling the baby move.  If you have had any abnormal symptoms, such as leaking fluid, bleeding, severe headaches, or abdominal   cramping.  If you are using any tobacco products, including cigarettes, chewing tobacco, and electronic cigarettes.  If you have any questions. Other tests that may be performed during your first trimester include:  Blood tests to find your blood type and to check for the presence of any previous infections. The tests will also be used to check for low iron levels (anemia) and protein on red blood cells (Rh antibodies). Depending on your risk factors, or if you previously had diabetes during pregnancy, you may have tests to check for high blood sugar  that affects pregnant women (gestational diabetes).  Urine tests to check for infections, diabetes, or protein in the urine.  An ultrasound to confirm the proper growth and development of the baby.  Fetal screens for spinal cord problems (spina bifida) and Down syndrome.  HIV (human immunodeficiency virus) testing. Routine prenatal testing includes screening for HIV, unless you choose not to have this test.  You may need other tests to make sure you and the baby are doing well. Follow these instructions at home: Medicines  Follow your health care provider's instructions regarding medicine use. Specific medicines may be either safe or unsafe to take during pregnancy.  Take a prenatal vitamin that contains at least 600 micrograms (mcg) of folic acid.  If you develop constipation, try taking a stool softener if your health care provider approves. Eating and drinking   Eat a balanced diet that includes fresh fruits and vegetables, whole grains, good sources of protein such as meat, eggs, or tofu, and low-fat dairy. Your health care provider will help you determine the amount of weight gain that is right for you.  Avoid raw meat and uncooked cheese. These carry germs that can cause birth defects in the baby.  Eating four or five small meals rather than three large meals a day may help relieve nausea and vomiting. If you start to feel nauseous, eating a few soda crackers can be helpful. Drinking liquids between meals, instead of during meals, also seems to help ease nausea and vomiting.  Limit foods that are high in fat and processed sugars, such as fried and sweet foods.  To prevent constipation: ? Eat foods that are high in fiber, such as fresh fruits and vegetables, whole grains, and beans. ? Drink enough fluid to keep your urine clear or pale yellow. Activity  Exercise only as directed by your health care provider. Most women can continue their usual exercise routine during  pregnancy. Try to exercise for 30 minutes at least 5 days a week. Exercising will help you: ? Control your weight. ? Stay in shape. ? Be prepared for labor and delivery.  Experiencing pain or cramping in the lower abdomen or lower back is a good sign that you should stop exercising. Check with your health care provider before continuing with normal exercises.  Try to avoid standing for long periods of time. Move your legs often if you must stand in one place for a long time.  Avoid heavy lifting.  Wear low-heeled shoes and practice good posture.  You may continue to have sex unless your health care provider tells you not to. Relieving pain and discomfort  Wear a good support bra to relieve breast tenderness.  Take warm sitz baths to soothe any pain or discomfort caused by hemorrhoids. Use hemorrhoid cream if your health care provider approves.  Rest with your legs elevated if you have leg cramps or low back pain.  If you develop varicose veins in   your legs, wear support hose. Elevate your feet for 15 minutes, 3-4 times a day. Limit salt in your diet. Prenatal care  Schedule your prenatal visits by the twelfth week of pregnancy. They are usually scheduled monthly at first, then more often in the last 2 months before delivery.  Write down your questions. Take them to your prenatal visits.  Keep all your prenatal visits as told by your health care provider. This is important. Safety  Wear your seat belt at all times when driving.  Make a list of emergency phone numbers, including numbers for family, friends, the hospital, and police and fire departments. General instructions  Ask your health care provider for a referral to a local prenatal education class. Begin classes no later than the beginning of month 6 of your pregnancy.  Ask for help if you have counseling or nutritional needs during pregnancy. Your health care provider can offer advice or refer you to specialists for help  with various needs.  Do not use hot tubs, steam rooms, or saunas.  Do not douche or use tampons or scented sanitary pads.  Do not cross your legs for long periods of time.  Avoid cat litter boxes and soil used by cats. These carry germs that can cause birth defects in the baby and possibly loss of the fetus by miscarriage or stillbirth.  Avoid all smoking, herbs, alcohol, and medicines not prescribed by your health care provider. Chemicals in these products affect the formation and growth of the baby.  Do not use any products that contain nicotine or tobacco, such as cigarettes and e-cigarettes. If you need help quitting, ask your health care provider. You may receive counseling support and other resources to help you quit.  Schedule a dentist appointment. At home, brush your teeth with a soft toothbrush and be gentle when you floss. Contact a health care provider if:  You have dizziness.  You have mild pelvic cramps, pelvic pressure, or nagging pain in the abdominal area.  You have persistent nausea, vomiting, or diarrhea.  You have a bad smelling vaginal discharge.  You have pain when you urinate.  You notice increased swelling in your face, hands, legs, or ankles.  You are exposed to fifth disease or chickenpox.  You are exposed to German measles (rubella) and have never had it. Get help right away if:  You have a fever.  You are leaking fluid from your vagina.  You have spotting or bleeding from your vagina.  You have severe abdominal cramping or pain.  You have rapid weight gain or loss.  You vomit blood or material that looks like coffee grounds.  You develop a severe headache.  You have shortness of breath.  You have any kind of trauma, such as from a fall or a car accident. Summary  The first trimester of pregnancy is from week 1 until the end of week 13 (months 1 through 3).  Your body goes through many changes during pregnancy. The changes vary from  woman to woman.  You will have routine prenatal visits. During those visits, your health care provider will examine you, discuss any test results you may have, and talk with you about how you are feeling. This information is not intended to replace advice given to you by your health care provider. Make sure you discuss any questions you have with your health care provider. Document Revised: 08/20/2017 Document Reviewed: 08/19/2016 Elsevier Patient Education  2020 [employer]  Commonly Asked Questions During Pregnancy    Cats: A parasite can be excreted in cat feces.  To avoid exposure you need to have another person empty the little box.  If you must empty the litter box you will need to wear gloves.  Wash your hands after handling your cat.  This parasite can also be found in raw or undercooked meat so this should also be avoided.  Colds, Sore Throats, Flu: Please check your medication sheet to see what you can take for symptoms.  If your symptoms are unrelieved by these medications please call the office.  Dental Work: Most any dental work your dentist recommends is permitted.  X-rays should only be taken during the first trimester if absolutely necessary.  Your abdomen should be shielded with a lead apron during all x-rays.  Please notify your provider prior to receiving any x-rays.  Novocaine is fine; gas is not recommended.  If your dentist requires a note from us prior to dental work please call the office and we will provide one for you.  Exercise: Exercise is an important part of staying healthy during your pregnancy.  You may continue most exercises you were accustomed to prior to pregnancy.  Later in your pregnancy you will most likely notice you have difficulty with activities requiring balance like riding a bicycle.  It is important that you listen to your body and avoid activities that put you at a higher risk of falling.  Adequate rest and staying well hydrated are a must!  If you have  questions about the safety of specific activities ask your provider.    Exposure to Children with illness: Try to avoid obvious exposure; report any symptoms to us when noted,  If you have chicken pos, red measles or mumps, you should be immune to these diseases.   Please do not take any vaccines while pregnant unless you have checked with your OB provider.  Fetal Movement: After 28 weeks we recommend you do "kick counts" twice daily.  Lie or sit down in a calm quiet environment and count your baby movements "kicks".  You should feel your baby at least 10 times per hour.  If you have not felt 10 kicks within the first hour get up, walk around and have something sweet to eat or drink then repeat for an additional hour.  If count remains less than 10 per hour notify your provider.  Fumigating: Follow your pest control agent's advice as to how long to stay out of your home.  Ventilate the area well before re-entering.  Hemorrhoids:   Most over-the-counter preparations can be used during pregnancy.  Check your medication to see what is safe to use.  It is important to use a stool softener or fiber in your diet and to drink lots of liquids.  If hemorrhoids seem to be getting worse please call the office.   Hot Tubs:  Hot tubs Jacuzzis and saunas are not recommended while pregnant.  These increase your internal body temperature and should be avoided.  Intercourse:  Sexual intercourse is safe during pregnancy as long as you are comfortable, unless otherwise advised by your provider.  Spotting may occur after intercourse; report any bright red bleeding that is heavier than spotting.  Labor:  If you know that you are in labor, please go to the hospital.  If you are unsure, please call the office and let us help you decide what to do.  Lifting, straining, etc:  If your job requires heavy lifting or straining please check with   your provider for any limitations.  Generally, you should not lift items heavier than  that you can lift simply with your hands and arms (no back muscles)  Painting:  Paint fumes do not harm your pregnancy, but may make you ill and should be avoided if possible.  Latex or water based paints have less odor than oils.  Use adequate ventilation while painting.  Permanents & Hair Color:  Chemicals in hair dyes are not recommended as they cause increase hair dryness which can increase hair loss during pregnancy.  " Highlighting" and permanents are allowed.  Dye may be absorbed differently and permanents may not hold as well during pregnancy.  Sunbathing:  Use a sunscreen, as skin burns easily during pregnancy.  Drink plenty of fluids; avoid over heating.  Tanning Beds:  Because their possible side effects are still unknown, tanning beds are not recommended.  Ultrasound Scans:  Routine ultrasounds are performed at approximately 20 weeks.  You will be able to see your baby's general anatomy an if you would like to know the gender this can usually be determined as well.  If it is questionable when you conceived you may also receive an ultrasound early in your pregnancy for dating purposes.  Otherwise ultrasound exams are not routinely performed unless there is a medical necessity.  Although you can request a scan we ask that you pay for it when conducted because insurance does not cover " patient request" scans.  Work: If your pregnancy proceeds without complications you may work until your due date, unless your physician or employer advises otherwise.  Round Ligament Pain/Pelvic Discomfort:  Sharp, shooting pains not associated with bleeding are fairly common, usually occurring in the second trimester of pregnancy.  They tend to be worse when standing up or when you remain standing for long periods of time.  These are the result of pressure of certain pelvic ligaments called "round ligaments".  Rest, Tylenol and heat seem to be the most effective relief.  As the womb and fetus grow, they rise  out of the pelvis and the discomfort improves.  Please notify the office if your pain seems different than that described.  It may represent a more serious condition.   How a Baby Grows During Pregnancy  Pregnancy begins when a female's sperm enters a female's egg (fertilization). Fertilization usually happens in one of the tubes (fallopian tubes) that connect the ovaries to the womb (uterus). The fertilized egg moves down the fallopian tube to the uterus. Once it reaches the uterus, it implants into the lining of the uterus and begins to grow. For the first 10 weeks, the fertilized egg is called an embryo. After 10 weeks, it is called a fetus. As the fetus continues to grow, it receives oxygen and nutrients through tissue (placenta) that grows to support the developing baby. The placenta is the life support system for the baby. It provides oxygen and nutrition and removes waste. Learning as much as you can about your pregnancy and how your baby is developing can help you enjoy the experience. It can also make you aware of when there might be a problem and when to ask questions. How long does a typical pregnancy last? A pregnancy usually lasts 280 days, or about 40 weeks. Pregnancy is divided into three periods of growth, also called trimesters:  First trimester: 0-12 weeks.  Second trimester: 13-27 weeks.  Third trimester: 28-40 weeks. The day when your baby is ready to be born (full term) is   your estimated date of delivery. How does my baby develop month by month? First month  The fertilized egg attaches to the inside of the uterus.  Some cells will form the placenta. Others will form the fetus.  The arms, legs, brain, spinal cord, lungs, and heart begin to develop.  At the end of the first month, the heart begins to beat. Second month  The bones, inner ear, eyelids, hands, and feet form.  The genitals develop.  By the end of 8 weeks, all major organs are developing. Third  month  All of the internal organs are forming.  Teeth develop below the gums.  Bones and muscles begin to grow. The spine can flex.  The skin is transparent.  Fingernails and toenails begin to form.  Arms and legs continue to grow longer, and hands and feet develop.  The fetus is about 3 inches (7.6 cm) long. Fourth month  The placenta is completely formed.  The external sex organs, neck, outer ear, eyebrows, eyelids, and fingernails are formed.  The fetus can hear, swallow, and move its arms and legs.  The kidneys begin to produce urine.  The skin is covered with a white, waxy coating (vernix) and very fine hair (lanugo). Fifth month  The fetus moves around more and can be felt for the first time (quickening).  The fetus starts to sleep and wake up and may begin to suck its finger.  The nails grow to the end of the fingers.  The organ in the digestive system that makes bile (gallbladder) functions and helps to digest nutrients.  If your baby is a girl, eggs are present in her ovaries. If your baby is a boy, testicles start to move down into his scrotum. Sixth month  The lungs are formed.  The eyes open. The brain continues to develop.  Your baby has fingerprints and toe prints. Your baby's hair grows thicker.  At the end of the second trimester, the fetus is about 9 inches (22.9 cm) long. Seventh month  The fetus kicks and stretches.  The eyes are developed enough to sense changes in light.  The hands can make a grasping motion.  The fetus responds to sound. Eighth month  All organs and body systems are fully developed and functioning.  Bones harden, and taste buds develop. The fetus may hiccup.  Certain areas of the brain are still developing. The skull remains soft. Ninth month  The fetus gains about  lb (0.23 kg) each week.  The lungs are fully developed.  Patterns of sleep develop.  The fetus's head typically moves into a head-down position  (vertex) in the uterus to prepare for birth.  The fetus weighs 6-9 lb (2.72-4.08 kg) and is 19-20 inches (48.26-50.8 cm) long. What can I do to have a healthy pregnancy and help my baby develop? General instructions  Take prenatal vitamins as directed by your health care provider. These include vitamins such as folic acid, iron, calcium, and vitamin D. They are important for healthy development.  Take medicines only as directed by your health care provider. Read labels and ask a pharmacist or your health care provider whether over-the-counter medicines, supplements, and prescription drugs are safe to take during pregnancy.  Keep all follow-up visits as directed by your health care provider. This is important. Follow-up visits include prenatal care and screening tests. How do I know if my baby is developing well? At each prenatal visit, your health care provider will do several different tests to   check on your health and keep track of your baby's development. These include:  Fundal height and position. ? Your health care provider will measure your growing belly from your pubic bone to the top of the uterus using a tape measure. ? Your health care provider will also feel your belly to determine your baby's position.  Heartbeat. ? An ultrasound in the first trimester can confirm pregnancy and show a heartbeat, depending on how far along you are. ? Your health care provider will check your baby's heart rate at every prenatal visit.  Second trimester ultrasound. ? This ultrasound checks your baby's development. It also may show your baby's gender. What should I do if I have concerns about my baby's development? Always talk with your health care provider about any concerns that you may have about your pregnancy and your baby. Summary  A pregnancy usually lasts 280 days, or about 40 weeks. Pregnancy is divided into three periods of growth, also called trimesters.  Your health care provider  will monitor your baby's growth and development throughout your pregnancy.  Follow your health care provider's recommendations about taking prenatal vitamins and medicines during your pregnancy.  Talk with your health care provider if you have any concerns about your pregnancy or your developing baby. This information is not intended to replace advice given to you by your health care provider. Make sure you discuss any questions you have with your health care provider. Document Revised: 12/29/2018 Document Reviewed: 07/21/2017 Elsevier Patient Education  2020 [employer]  

## 2019-10-23 ENCOUNTER — Other Ambulatory Visit: Payer: Self-pay

## 2019-10-23 ENCOUNTER — Ambulatory Visit (INDEPENDENT_AMBULATORY_CARE_PROVIDER_SITE_OTHER): Payer: Self-pay | Admitting: Obstetrics and Gynecology

## 2019-10-23 VITALS — BP 100/69 | HR 80 | Ht 66.0 in | Wt 122.4 lb

## 2019-10-23 DIAGNOSIS — Z113 Encounter for screening for infections with a predominantly sexual mode of transmission: Secondary | ICD-10-CM

## 2019-10-23 DIAGNOSIS — Z0283 Encounter for blood-alcohol and blood-drug test: Secondary | ICD-10-CM

## 2019-10-23 DIAGNOSIS — Z3491 Encounter for supervision of normal pregnancy, unspecified, first trimester: Secondary | ICD-10-CM

## 2019-10-23 NOTE — Progress Notes (Signed)
Ahleah Furney presents for NOB nurse interview visit.  Pregnancy confirmation done at [facility].  G-1.  P-0.  LMP 08/20/2019.  EDD 05/26/2020. Dating scan not done.  Pregnancy education material explained and given.  No cats in the home.  NOB labs ordered.  TSH/HgA1C not ordered.  Body mass index is 19.76 kg/m.  Sickle cell ordered. HIV and drug screen were explained and ordered.  PNV encouraged.  Genetic screening options discussed.  Genetic testing: Unsure.  FMLA form signed and financial policy reviewed.  RTC for NOB physical in 2 weeks with Dr. Valentino Saxon.

## 2019-10-24 ENCOUNTER — Other Ambulatory Visit: Payer: Self-pay

## 2019-10-24 ENCOUNTER — Ambulatory Visit (INDEPENDENT_AMBULATORY_CARE_PROVIDER_SITE_OTHER): Payer: Self-pay

## 2019-10-24 DIAGNOSIS — O3680X0 Pregnancy with inconclusive fetal viability, not applicable or unspecified: Secondary | ICD-10-CM

## 2019-10-24 DIAGNOSIS — Z3A09 9 weeks gestation of pregnancy: Secondary | ICD-10-CM

## 2019-10-24 LAB — HIV ANTIBODY (ROUTINE TESTING W REFLEX): HIV Screen 4th Generation wRfx: NONREACTIVE

## 2019-10-24 LAB — URINALYSIS, ROUTINE W REFLEX MICROSCOPIC
Bilirubin, UA: NEGATIVE
Glucose, UA: NEGATIVE
Ketones, UA: NEGATIVE
Leukocytes,UA: NEGATIVE
Nitrite, UA: NEGATIVE
Protein,UA: NEGATIVE
RBC, UA: NEGATIVE
Specific Gravity, UA: 1.018 (ref 1.005–1.030)
Urobilinogen, Ur: 0.2 mg/dL (ref 0.2–1.0)
pH, UA: 6 (ref 5.0–7.5)

## 2019-10-24 LAB — ABO AND RH: Rh Factor: POSITIVE

## 2019-10-24 LAB — ANTIBODY SCREEN: Antibody Screen: NEGATIVE

## 2019-10-24 LAB — RUBELLA SCREEN: Rubella Antibodies, IGG: 12.2 index (ref >0.99)

## 2019-10-24 LAB — VARICELLA ZOSTER ANTIBODY, IGG: Varicella zoster IgG: >4000 index (ref >165)

## 2019-10-24 LAB — RPR: RPR Ser Ql: NONREACTIVE

## 2019-10-24 LAB — GC/CHLAMYDIA PROBE AMP
Chlamydia trachomatis, NAA: NEGATIVE
Neisseria Gonorrhoeae by PCR: NEGATIVE

## 2019-10-24 LAB — SICKLE CELL SCREEN: Sickle Cell Screen: POSITIVE — AB

## 2019-10-24 LAB — HEPATITIS B SURFACE ANTIGEN: Hepatitis B Surface Ag: NEGATIVE

## 2019-10-25 LAB — NICOTINE SCREEN, URINE: Cotinine Ql Scrn, Ur: NEGATIVE ng/mL

## 2019-10-25 LAB — MONITOR DRUG PROFILE 14(MW)
Amphetamine Scrn, Ur: NEGATIVE ng/mL
BARBITURATE SCREEN URINE: NEGATIVE ng/mL
BENZODIAZEPINE SCREEN, URINE: NEGATIVE ng/mL
Buprenorphine, Urine: NEGATIVE ng/mL
CANNABINOIDS UR QL SCN: NEGATIVE ng/mL
Cocaine (Metab) Scrn, Ur: NEGATIVE ng/mL
Creatinine(Crt), U: 186.2 mg/dL (ref 20.0–300.0)
Fentanyl, Urine: NEGATIVE pg/mL
Meperidine Screen, Urine: NEGATIVE ng/mL
Methadone Screen, Urine: NEGATIVE ng/mL
OXYCODONE+OXYMORPHONE UR QL SCN: NEGATIVE ng/mL
Opiate Scrn, Ur: NEGATIVE ng/mL
Ph of Urine: 5.9 (ref 4.5–8.9)
Phencyclidine Qn, Ur: NEGATIVE ng/mL
Propoxyphene Scrn, Ur: NEGATIVE ng/mL
SPECIFIC GRAVITY: 1.017
Tramadol Screen, Urine: NEGATIVE ng/mL

## 2019-10-25 LAB — URINE CULTURE, OB REFLEX

## 2019-10-25 LAB — CULTURE, OB URINE

## 2019-10-25 NOTE — Progress Notes (Signed)
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  It is recommended that her partner be tested if he is unaware of his status.

## 2019-10-27 ENCOUNTER — Telehealth: Payer: Self-pay

## 2019-10-27 NOTE — Telephone Encounter (Signed)
Duplicate message sent in error.

## 2019-10-27 NOTE — Telephone Encounter (Signed)
Helen Schneider woke up with a bad nose bleed. Patient is 9 weeks and 4 days pregnant. She wanted to know if she should come in for a visit for these nose bleeds she is experiencing. I informed the patient that you would get back to her at your earliest convenience.  Thank you, Ardelle Lesches

## 2019-10-27 NOTE — Telephone Encounter (Deleted)
Good afternoon,  Helen Schneider called and stated that she was experiencing heavy nose bleeds. She woke up with morning with one. She is [redacted] weeks pregnant. She wants to know if she needs to make an appointment to be seen for this issue she is having. I told her that you would contact her back at your earliest convenience.   -Ardelle Lesches

## 2019-10-27 NOTE — Telephone Encounter (Signed)
Called and spoke with patient.  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

## 2019-10-27 NOTE — Telephone Encounter (Signed)
Called and spoke with patient.  Patient c/o nose bleed today.  Advised patient next time this happens to firmly pinch the soft part of nose, sit or stand upright and apply an ice pack.  Patient also aware that nose bleeds are common in pregnancy but if symptoms persist or get worse to let us know and patient verbalized understanding.

## 2019-11-07 ENCOUNTER — Telehealth: Payer: Self-pay

## 2019-11-07 NOTE — Telephone Encounter (Signed)
OB Pt 11 2/7   Pt would like to take a stool softener? Pls advise.

## 2019-11-07 NOTE — Telephone Encounter (Signed)
Called and spoke with patient.  Advised that it is ok to take stool softener and encouraged her to look in folder that was given during nurse intake for common medications safe in pregnancy.  Patient verbalized understanding.

## 2019-11-08 ENCOUNTER — Other Ambulatory Visit: Payer: Self-pay

## 2019-11-08 ENCOUNTER — Ambulatory Visit (INDEPENDENT_AMBULATORY_CARE_PROVIDER_SITE_OTHER): Payer: Self-pay | Admitting: Obstetrics and Gynecology

## 2019-11-08 ENCOUNTER — Encounter: Payer: Self-pay | Admitting: Obstetrics and Gynecology

## 2019-11-08 VITALS — BP 100/65 | HR 98 | Wt 124.1 lb

## 2019-11-08 DIAGNOSIS — D573 Sickle-cell trait: Secondary | ICD-10-CM | POA: Insufficient documentation

## 2019-11-08 DIAGNOSIS — Z3A11 11 weeks gestation of pregnancy: Secondary | ICD-10-CM

## 2019-11-08 DIAGNOSIS — Z3401 Encounter for supervision of normal first pregnancy, first trimester: Secondary | ICD-10-CM

## 2019-11-08 DIAGNOSIS — O26891 Other specified pregnancy related conditions, first trimester: Secondary | ICD-10-CM

## 2019-11-08 DIAGNOSIS — R519 Headache, unspecified: Secondary | ICD-10-CM

## 2019-11-08 LAB — POCT URINALYSIS DIPSTICK OB
Bilirubin, UA: NEGATIVE
Blood, UA: NEGATIVE
Glucose, UA: NEGATIVE
Ketones, UA: NEGATIVE
Leukocytes, UA: NEGATIVE
Nitrite, UA: NEGATIVE
POC,PROTEIN,UA: NEGATIVE
Spec Grav, UA: 1.01 (ref 1.010–1.025)
Urobilinogen, UA: 0.2 E.U./dL
pH, UA: 7.5 (ref 5.0–8.0)

## 2019-11-08 NOTE — Progress Notes (Signed)
ROB-Present for NOB PE pt is currently [redacted]w[redacted]d G1P0. Pt desires Avelina Laine genetic testing. [facility] completed today.

## 2019-11-08 NOTE — Progress Notes (Signed)
OBSTETRIC INITIAL PRENATAL VISIT  Subjective:    Helen Schneider is being seen today for her first obstetrical visit.  This is not a planned pregnancy. She is a 29 y.o. G1P0 female at [redacted]w[redacted]d gestation, Estimated Date of Delivery: 05/27/20 with Patient's last menstrual period was 08/20/2019 (exact date), consistent with 9 week sono. Her obstetrical history is significant for none. Relationship with FOB: spouse, living together. Patient does intend to breast feed. Pregnancy history fully reviewed.    OB History  Gravida Para Term Preterm AB Living  1 0 0 0 0 0  SAB TAB Ectopic Multiple Live Births  0 0 0 0 0    # Outcome Date GA Lbr Len/2nd Weight Sex Delivery Anes PTL Lv  1 Current             Gynecologic History:  Last pap smear was 2019 (performed in [place]).  Results were normal.  Denies h/o abnormal pap smears in the past.  Denies history of STIs.  Contraception: Was on OCPs, discontinued in October 2020.   Past Medical History:  Diagnosis Date  . Constipation 2016  . IBS (irritable bowel syndrome) 2016  . Iron deficiency anemia     Family History  Problem Relation Age of Onset  . Multiple sclerosis Father   . Lung cancer Maternal Grandfather   . Breast cancer Neg Hx   . Ovarian cancer Neg Hx   . Colon cancer Neg Hx     Past Surgical History:  Procedure Laterality Date  . WISDOM TOOTH EXTRACTION  2013    Social History   Socioeconomic History  . Marital status: Married    Spouse name: Not on file  . Number of children: 0  . Years of education: Not on file  . Highest education level: Not on file  Occupational History  . Not on file  Tobacco Use  . Smoking status: Never Smoker  . Smokeless tobacco: Never Used  Substance and Sexual Activity  . Alcohol use: Never  . Drug use: Never  . Sexual activity: Yes    Birth control/protection: None  Other Topics Concern  . Not on file  Social History Narrative  . Not on file   Social Determinants of Health    Financial Resource Strain:   . Difficulty of Paying Living Expenses: Not on file  Food Insecurity:   . Worried About Programme researcher, broadcasting/film/video in the Last Year: Not on file  . Ran Out of Food in the Last Year: Not on file  Transportation Needs:   . Lack of Transportation (Medical): Not on file  . Lack of Transportation (Non-Medical): Not on file  Physical Activity:   . Days of Exercise per Week: Not on file  . Minutes of Exercise per Session: Not on file  Stress:   . Feeling of Stress : Not on file  Social Connections:   . Frequency of Communication with Friends and Family: Not on file  . Frequency of Social Gatherings with Friends and Family: Not on file  . Attends Religious Services: Not on file  . Active Member of Clubs or Organizations: Not on file  . Attends Banker Meetings: Not on file  . Marital Status: Not on file  Intimate Partner Violence:   . Fear of Current or Ex-Partner: Not on file  . Emotionally Abused: Not on file  . Physically Abused: Not on file  . Sexually Abused: Not on file    Current Outpatient Medications on File  Prior to Visit  Medication Sig Dispense Refill  . Ascorbic Acid (VITAMIN C) 100 MG tablet Take 100 mg by mouth daily.    . ferrous sulfate 325 (65 FE) MG EC tablet Take 325 mg by mouth 3 (three) times daily with meals.    . Prenatal Vit-Fe Fumarate-FA (MULTIVITAMIN-PRENATAL) 27-0.8 MG TABS tablet Take 1 tablet by mouth daily at 12 noon.    . Probiotic Product (PROBIOTIC PO) Take by mouth.     No current facility-administered medications on file prior to visit.    No Known Allergies   Review of Systems General: Not Present- Fever, Weight Loss and Weight Gain. Skin: Not Present- Rash. HEENT: Not Present- Blurred Vision, Headache and Bleeding Gums. Respiratory: Not Present- Difficulty Breathing. Breast: Not Present- Breast Mass. Cardiovascular: Not Present- Chest Pain, Elevated Blood Pressure, Fainting / Blacking Out and Shortness  of Breath. Gastrointestinal: Not Present- Abdominal Pain, Constipation, Nausea and Vomiting. Female Genitourinary: Not Present- Frequency, Painful Urination, Pelvic Pain, Vaginal Bleeding, Vaginal Discharge, Contractions, regular, Fetal Movements Decreased, Urinary Complaints and Vaginal Fluid. Musculoskeletal: Not Present- Back Pain and Leg Cramps. Neurological: Not Present- Dizziness. Psychiatric: Not Present- Depression.     Objective:   Blood pressure 100/65, pulse 98, weight 124 lb 1.6 oz (56.3 kg), last menstrual period 08/20/2019.  Body mass index is 20.03 kg/m.   General Appearance:    Alert, cooperative, no distress, appears stated age  Head:    Normocephalic, without obvious abnormality, atraumatic  Eyes:    PERRL, conjunctiva/corneas clear, EOM's intact, both eyes  Ears:    Normal external ear canals, both ears  Nose:   Nares normal, septum midline, mucosa normal, no drainage or sinus tenderness  Throat:   Lips, mucosa, and tongue normal; teeth and gums normal  Neck:   Supple, symmetrical, trachea midline, no adenopathy; thyroid: no enlargement/tenderness/nodules; no carotid bruit or JVD  Back:     Symmetric, no curvature, ROM normal, no CVA tenderness  Lungs:     Clear to auscultation bilaterally, respirations unlabored  Chest Wall:    No tenderness or deformity   Heart:    Regular rate and rhythm, S1 and S2 normal, no murmur, rub or gallop  Breast Exam:    No tenderness, masses, or nipple abnormality  Abdomen:     Soft, non-tender, bowel sounds active all four quadrants, no masses, no organomegaly.  FH 11.  FHT 165 bpm.  Genitalia:    Pelvic:external genitalia normal, vagina without lesions, discharge, or tenderness, rectovaginal septum  normal. Cervix normal in appearance, no cervical motion tenderness, no adnexal masses or tenderness.  Pregnancy positive findings: uterine enlargement: 11 wk size, nontender.   Rectal:    Normal external sphincter.  No hemorrhoids appreciated.  Internal exam not done.   Extremities:   Extremities normal, atraumatic, no cyanosis or edema  Pulses:   2+ and symmetric all extremities  Skin:   Skin color, texture, turgor normal, no rashes or lesions  Lymph nodes:   Cervical, supraclavicular, and axillary nodes normal  Neurologic:   CNII-XII intact, normal strength, sensation and reflexes throughout      Assessment:   1. Encounter for supervision of normal first pregnancy in first trimester   2. Sickle cell trait (HCC)   3. Pregnancy headache in first trimester     Plan:   1. Supervision of normal pregnancy  -  Initial labs reviewed. - Prenatal vitamins encouraged. - Problem list reviewed and updated. - New OB counseling:  The patient has  been given an overview regarding routine prenatal care.  Recommendations regarding diet, weight gain, and exercise in pregnancy were given. - Prenatal testing, optional genetic testing, and ultrasound use in pregnancy were reviewed.  Panorama: ordered. - Benefits of Breast Feeding were discussed. The patient is encouraged to consider nursing her baby post partum.  2. Sickle cell trait - Patient reports that her husband was tested at PCP office, was negative. No further concerns for current or future pregnancies at this time.   3. Headaches - Patient with headaches, notes Tylenol ES does not really help. Discussed cool compresses to the head, can take occasional Excedrin if needed for intense headaches, or can prescribe medication.   The patient has Medicaid.  [facility] Medicaid Risk Screening Form completed today   Follow up in 4 weeks.  50% of 30 min visit spent on counseling and coordination of care.    Rubie Maid, MD [facility]

## 2019-11-08 NOTE — Patient Instructions (Addendum)
Second Trimester of Pregnancy  The second trimester is from week 14 through week 27 (month 4 through 6). This is often the time in pregnancy that you feel your best. Often times, morning sickness has lessened or quit. You may have more energy, and you may get hungry more often. Your unborn baby is growing rapidly. At the end of the sixth month, he or she is about 9 inches long and weighs about 1 pounds. You will likely feel the baby move between 18 and 20 weeks of pregnancy. Follow these instructions at home: Medicines  Take over-the-counter and prescription medicines only as told by your doctor. Some medicines are safe and some medicines are not safe during pregnancy.  Take a prenatal vitamin that contains at least 600 micrograms (mcg) of folic acid.  If you have trouble pooping (constipation), take medicine that will make your stool soft (stool softener) if your doctor approves. Eating and drinking   Eat regular, healthy meals.  Avoid raw meat and uncooked cheese.  If you get low calcium from the food you eat, talk to your doctor about taking a daily calcium supplement.  Avoid foods that are high in fat and sugars, such as fried and sweet foods.  If you feel sick to your stomach (nauseous) or throw up (vomit): ? Eat 4 or 5 small meals a day instead of 3 large meals. ? Try eating a few soda crackers. ? Drink liquids between meals instead of during meals.  To prevent constipation: ? Eat foods that are high in fiber, like fresh fruits and vegetables, whole grains, and beans. ? Drink enough fluids to keep your pee (urine) clear or pale yellow. Activity  Exercise only as told by your doctor. Stop exercising if you start to have cramps.  Do not exercise if it is too hot, too humid, or if you are in a place of great height (high altitude).  Avoid heavy lifting.  Wear low-heeled shoes. Sit and stand up straight.  You can continue to have sex unless your doctor tells you not  to. Relieving pain and discomfort  Wear a good support bra if your breasts are tender.  Take warm water baths (sitz baths) to soothe pain or discomfort caused by hemorrhoids. Use hemorrhoid cream if your doctor approves.  Rest with your legs raised if you have leg cramps or low back pain.  If you develop puffy, bulging veins (varicose veins) in your legs: ? Wear support hose or compression stockings as told by your doctor. ? Raise (elevate) your feet for 15 minutes, 3-4 times a day. ? Limit salt in your food. Prenatal care  Write down your questions. Take them to your prenatal visits.  Keep all your prenatal visits as told by your doctor. This is important. Safety  Wear your seat belt when driving.  Make a list of emergency phone numbers, including numbers for family, friends, the hospital, and police and fire departments. General instructions  Ask your doctor about the right foods to eat or for help finding a counselor, if you need these services.  Ask your doctor about local prenatal classes. Begin classes before month 6 of your pregnancy.  Do not use hot tubs, steam rooms, or saunas.  Do not douche or use tampons or scented sanitary pads.  Do not cross your legs for long periods of time.  Visit your dentist if you have not done so. Use a soft toothbrush to brush your teeth. Floss gently.  Avoid all smoking, herbs,   and alcohol. Avoid drugs that are not approved by your doctor.  Do not use any products that contain nicotine or tobacco, such as cigarettes and e-cigarettes. If you need help quitting, ask your doctor.  Avoid cat litter boxes and soil used by cats. These carry germs that can cause birth defects in the baby and can cause a loss of your baby (miscarriage) or stillbirth. Contact a doctor if:  You have mild cramps or pressure in your lower belly.  You have pain when you pee (urinate).  You have bad smelling fluid coming from your vagina.  You continue to  feel sick to your stomach (nauseous), throw up (vomit), or have watery poop (diarrhea).  You have a nagging pain in your belly area.  You feel dizzy. Get help right away if:  You have a fever.  You are leaking fluid from your vagina.  You have spotting or bleeding from your vagina.  You have severe belly cramping or pain.  You lose or gain weight rapidly.  You have trouble catching your breath and have chest pain.  You notice sudden or extreme puffiness (swelling) of your face, hands, ankles, feet, or legs.  You have not felt the baby move in over an hour.  You have severe headaches that do not go away when you take medicine.  You have trouble seeing. Summary  The second trimester is from week 14 through week 27 (months 4 through 6). This is often the time in pregnancy that you feel your best.  To take care of yourself and your unborn baby, you will need to eat healthy meals, take medicines only if your doctor tells you to do so, and do activities that are safe for you and your baby.  Call your doctor if you get sick or if you notice anything unusual about your pregnancy. Also, call your doctor if you need help with the right food to eat, or if you want to know what activities are safe for you. This information is not intended to replace advice given to you by your health care provider. Make sure you discuss any questions you have with your health care provider. Document Revised: 12/30/2018 Document Reviewed: 10/13/2016 Elsevier Patient Education  2020 [employer] Common Medications Safe in Pregnancy  Acne:      Constipation:  Benzoyl Peroxide     Colace  Clindamycin      Dulcolax Suppository  Topica Erythromycin     Fibercon  Salicylic Acid      Metamucil         Miralax AVOID:        Senakot   Accutane    Cough:  Retin-A       Cough Drops  Tetracycline      Phenergan w/ Codeine if Rx  Minocycline      Robitussin (Plain &  DM)  Antibiotics:     Crabs/Lice:  Ceclor       RID  Cephalosporins    AVOID:  E-Mycins      Kwell  Keflex  Macrobid/Macrodantin   Diarrhea:  Penicillin      Kao-Pectate  Zithromax      Imodium AD         PUSH FLUIDS AVOID:       Cipro     Fever:  Tetracycline      Tylenol (Regular or Extra  Minocycline       Strength)  Levaquin      Extra Strength-Do not            Exceed 8 tabs/24 hrs Caffeine:        <235m/day (equiv. To 1 cup of coffee or  approx. 3 12 oz sodas)         Gas: Cold/Hayfever:       Gas-X  Benadryl      Mylicon  Claritin       Phazyme  **Claritin-D        Chlor-Trimeton    Headaches:  Dimetapp      ASA-Free Excedrin  Drixoral-Non-Drowsy     Cold Compress  Mucinex (Guaifenasin)     Tylenol (Regular or Extra  Sudafed/Sudafed-12 Hour     Strength)  **Sudafed PE Pseudoephedrine   Tylenol Cold & Sinus     Vicks Vapor Rub  Zyrtec  **AVOID if Problems With Blood Pressure         Heartburn: Avoid lying down for at least 1 hour after meals  Aciphex      Maalox     Rash:  Milk of Magnesia     Benadryl    Mylanta       1% Hydrocortisone Cream  Pepcid  Pepcid Complete   Sleep Aids:  Prevacid      Ambien   Prilosec       Benadryl  Rolaids       Chamomile Tea  Tums (Limit 4/day)     Unisom  Zantac       Tylenol PM         Warm milk-add vanilla or  Hemorrhoids:       Sugar for taste  Anusol/Anusol H.C.  (RX: Analapram 2.5%)  Sugar Substitutes:  Hydrocortisone OTC     Ok in moderation  Preparation H      Tucks        Vaseline lotion applied to tissue with wiping    Herpes:     Throat:  Acyclovir      Oragel  Famvir  Valtrex     Vaccines:         Flu Shot Leg Cramps:       *Gardasil  Benadryl      Hepatitis A         Hepatitis B Nasal Spray:       Pneumovax  Saline Nasal Spray     Polio Booster         Tetanus Nausea:       Tuberculosis test or PPD  Vitamin B6 25 mg TID   AVOID:    Dramamine      *Gardasil  Emetrol       Live  Poliovirus  Ginger Root 250 mg QID    MMR (measles, mumps &  High Complex Carbs @ Bedtime    rebella)  Sea Bands-Accupressure    Varicella (Chickenpox)  Unisom 1/2 tab TID     *No known complications           If received before Pain:         Known pregnancy;   Darvocet       Resume series after  Lortab        Delivery  Percocet    Yeast:   Tramadol      Femstat  Tylenol 3      Gyne-lotrimin  Ultram       Monistat  Vicodin           MISC:         All Sunscreens  Hair Coloring/highlights          Insect Repellant's          (Including DEET)         Mystic Tans  

## 2019-11-09 ENCOUNTER — Other Ambulatory Visit: Payer: Self-pay | Admitting: Obstetrics and Gynecology

## 2019-11-09 LAB — CBC
Hematocrit: 31.1 % — ABNORMAL LOW (ref 34.0–46.6)
Hemoglobin: 10.4 g/dL — ABNORMAL LOW (ref 11.1–15.9)
MCH: 31.6 pg (ref 26.6–33.0)
MCHC: 33.4 g/dL (ref 31.5–35.7)
MCV: 95 fL (ref 79–97)
Platelets: 224 10*3/uL (ref 150–450)
RBC: 3.29 x10E6/uL — ABNORMAL LOW (ref 3.77–5.28)
RDW: 12.8 % (ref 11.7–15.4)
WBC: 7.8 10*3/uL (ref 3.4–10.8)

## 2019-11-09 MED ORDER — FERROUS SULFATE 325 (65 FE) MG PO TBEC: 325.0000 mg | DELAYED_RELEASE_TABLET | Freq: Every day | ORAL | 1 refills | Status: DC

## 2019-12-06 ENCOUNTER — Ambulatory Visit (INDEPENDENT_AMBULATORY_CARE_PROVIDER_SITE_OTHER): Payer: Medicaid Other | Admitting: Obstetrics and Gynecology

## 2019-12-06 ENCOUNTER — Telehealth: Payer: Self-pay

## 2019-12-06 ENCOUNTER — Other Ambulatory Visit: Payer: Medicaid Other

## 2019-12-06 ENCOUNTER — Encounter: Payer: Self-pay | Admitting: Obstetrics and Gynecology

## 2019-12-06 ENCOUNTER — Other Ambulatory Visit: Payer: Self-pay

## 2019-12-06 VITALS — BP 109/72 | HR 96 | Wt 129.1 lb

## 2019-12-06 DIAGNOSIS — Z3A15 15 weeks gestation of pregnancy: Secondary | ICD-10-CM

## 2019-12-06 DIAGNOSIS — Z3402 Encounter for supervision of normal first pregnancy, second trimester: Secondary | ICD-10-CM

## 2019-12-06 LAB — POCT URINALYSIS DIPSTICK OB
Bilirubin, UA: NEGATIVE
Blood, UA: NEGATIVE
Glucose, UA: NEGATIVE
Ketones, UA: NEGATIVE
Leukocytes, UA: NEGATIVE
Nitrite, UA: NEGATIVE
POC,PROTEIN,UA: NEGATIVE
Spec Grav, UA: 1.01 (ref 1.010–1.025)
Urobilinogen, UA: 0.2 E.U./dL
pH, UA: 6.5 (ref 5.0–8.0)

## 2019-12-06 NOTE — Telephone Encounter (Signed)
Spoke with pt to explain what happen to her panorama results and pt scheduled an appointment for lab drawn to do another genetic testing.

## 2019-12-06 NOTE — Progress Notes (Signed)
ROB: Occasional headaches she describes them as "tension" headaches.  Advised increased hydration, use of Tylenol.  Desires AFP today.  FAS next visit.  Taking prenatal vitamins as directed.

## 2019-12-08 ENCOUNTER — Telehealth: Payer: Self-pay | Admitting: Obstetrics and Gynecology

## 2019-12-08 LAB — AFP, SERUM, OPEN SPINA BIFIDA
AFP MoM: 0.5
AFP Value: 17.7 ng/mL
Gest. Age on Collection Date: 15 weeks
Maternal Age At EDD: 29.6 yr
OSBR Risk 1 IN: 10000
Test Results:: NEGATIVE
Weight: 129 [lb_av]

## 2019-12-08 NOTE — Telephone Encounter (Signed)
The pts husband called in and stated that he is requesting a call back from you that his wife was seen yesterday and he has some concerns his name is [email or web address] his number is [phone] & his wife's MRN is [id number]

## 2019-12-08 NOTE — Telephone Encounter (Signed)
Pts husband Helen Schneider states his wife had an appt this week and the Lab tech - Helen Schneider was rude to her.   He states the lab tech said I hope you are not having a gender reveal party. The pt states it is not your concern to worry about if I am having a gender reveal party you need to just draw the blood.  Helen Schneider's reply was you need to be worried about having a healthy baby.  Helen Schneider stated that there may have been more to the conversation but that was the jist.   I apologized to Mr. Helen Schneider and thanked him for bringing it to my attention. Helen Schneider was not present for the visit. I asked him to please have Helen Schneider send me a my chart message withl all the details. I informed him once I got the message I would address it with Helen Schneider. I will follow up with pt and Helen Schneider.

## 2019-12-12 NOTE — Telephone Encounter (Signed)
Called pt to make sure she was able to send me a detail account of the issues Mr. Weiand and I discussed on 3/19. Pt states she can send a my chart message but was not sure where to send it to. I sent pt a my chart message and asked her to respond to the message with details of 3/17. Pt states she will. Will f/u with pt in a few days.

## 2019-12-13 LAB — MATERNIT21  PLUS CORE+ESS+SCA, BLOOD
11q23 deletion (Jacobsen): NOT DETECTED
15q11 deletion (PW Angelman): NOT DETECTED
1p36 deletion syndrome: NOT DETECTED
22q11 deletion (DiGeorge): NOT DETECTED
4p16 deletion(Wolf-Hirschhorn): NOT DETECTED
5p15 deletion (Cri-du-chat): NOT DETECTED
8q24 deletion (Langer-Giedion): NOT DETECTED
Fetal Fraction: 10
Monosomy X (Turner Syndrome): NOT DETECTED
Result (T21): NEGATIVE
Trisomy 13 (Patau syndrome): NEGATIVE
Trisomy 16: NOT DETECTED
Trisomy 18 (Edwards syndrome): NEGATIVE
Trisomy 21 (Down syndrome): NEGATIVE
Trisomy 22: NOT DETECTED
XXX (Triple X Syndrome): NOT DETECTED
XXY (Klinefelter Syndrome): NOT DETECTED
XYY (Jacobs Syndrome): NOT DETECTED

## 2019-12-21 NOTE — Telephone Encounter (Signed)
Pt aware I have addressed her concern with Mia and Mia's supervisor- Jennfer. Pt aware if any further concerns to please reach out to me. Thanked her for her feed back.

## 2020-01-03 ENCOUNTER — Ambulatory Visit (INDEPENDENT_AMBULATORY_CARE_PROVIDER_SITE_OTHER): Payer: Self-pay

## 2020-01-03 ENCOUNTER — Other Ambulatory Visit: Payer: Self-pay

## 2020-01-03 ENCOUNTER — Ambulatory Visit (INDEPENDENT_AMBULATORY_CARE_PROVIDER_SITE_OTHER): Payer: Self-pay | Admitting: Obstetrics and Gynecology

## 2020-01-03 ENCOUNTER — Encounter: Payer: Self-pay | Admitting: Obstetrics and Gynecology

## 2020-01-03 VITALS — BP 100/63 | HR 97 | Wt 136.4 lb

## 2020-01-03 DIAGNOSIS — Z3A19 19 weeks gestation of pregnancy: Secondary | ICD-10-CM

## 2020-01-03 DIAGNOSIS — Z3402 Encounter for supervision of normal first pregnancy, second trimester: Secondary | ICD-10-CM

## 2020-01-03 LAB — POCT URINALYSIS DIPSTICK OB
Bilirubin, UA: NEGATIVE
Blood, UA: NEGATIVE
Glucose, UA: NEGATIVE
Ketones, UA: NEGATIVE
Leukocytes, UA: NEGATIVE
Nitrite, UA: NEGATIVE
POC,PROTEIN,UA: NEGATIVE
Spec Grav, UA: 1.015 (ref 1.010–1.025)
Urobilinogen, UA: 0.2 E.U./dL
pH, UA: 6.5 (ref 5.0–8.0)

## 2020-01-03 NOTE — Progress Notes (Signed)
ROB: Patient still noting headaches, frequency is every other day. Manages with Tylenol, compresses to the head. Occasionally takes 1 Excedrin tablet.  Discussed COVID vaccination in pregnancy. Normal anatomy scan. RTC in 4 weeks.

## 2020-01-03 NOTE — Patient Instructions (Addendum)
Second Trimester of Pregnancy  The second trimester is from week 14 through week 27 (month 4 through 6). This is often the time in pregnancy that you feel your best. Often times, morning sickness has lessened or quit. You may have more energy, and you may get hungry more often. Your unborn baby is growing rapidly. At the end of the sixth month, he or she is about 9 inches long and weighs about 1 pounds. You will likely feel the baby move between 18 and 20 weeks of pregnancy. Follow these instructions at home: Medicines  Take over-the-counter and prescription medicines only as told by your doctor. Some medicines are safe and some medicines are not safe during pregnancy.  Take a prenatal vitamin that contains at least 600 micrograms (mcg) of folic acid.  If you have trouble pooping (constipation), take medicine that will make your stool soft (stool softener) if your doctor approves. Eating and drinking   Eat regular, healthy meals.  Avoid raw meat and uncooked cheese.  If you get low calcium from the food you eat, talk to your doctor about taking a daily calcium supplement.  Avoid foods that are high in fat and sugars, such as fried and sweet foods.  If you feel sick to your stomach (nauseous) or throw up (vomit): ? Eat 4 or 5 small meals a day instead of 3 large meals. ? Try eating a few soda crackers. ? Drink liquids between meals instead of during meals.  To prevent constipation: ? Eat foods that are high in fiber, like fresh fruits and vegetables, whole grains, and beans. ? Drink enough fluids to keep your pee (urine) clear or pale yellow. Activity  Exercise only as told by your doctor. Stop exercising if you start to have cramps.  Do not exercise if it is too hot, too humid, or if you are in a place of great height (high altitude).  Avoid heavy lifting.  Wear low-heeled shoes. Sit and stand up straight.  You can continue to have sex unless your doctor tells you not  to. Relieving pain and discomfort  Wear a good support bra if your breasts are tender.  Take warm water baths (sitz baths) to soothe pain or discomfort caused by hemorrhoids. Use hemorrhoid cream if your doctor approves.  Rest with your legs raised if you have leg cramps or low back pain.  If you develop puffy, bulging veins (varicose veins) in your legs: ? Wear support hose or compression stockings as told by your doctor. ? Raise (elevate) your feet for 15 minutes, 3-4 times a day. ? Limit salt in your food. Prenatal care  Write down your questions. Take them to your prenatal visits.  Keep all your prenatal visits as told by your doctor. This is important. Safety  Wear your seat belt when driving.  Make a list of emergency phone numbers, including numbers for family, friends, the hospital, and police and fire departments. General instructions  Ask your doctor about the right foods to eat or for help finding a counselor, if you need these services.  Ask your doctor about local prenatal classes. Begin classes before month 6 of your pregnancy.  Do not use hot tubs, steam rooms, or saunas.  Do not douche or use tampons or scented sanitary pads.  Do not cross your legs for long periods of time.  Visit your dentist if you have not done so. Use a soft toothbrush to brush your teeth. Floss gently.  Avoid all smoking, herbs,   and alcohol. Avoid drugs that are not approved by your doctor.  Do not use any products that contain nicotine or tobacco, such as cigarettes and e-cigarettes. If you need help quitting, ask your doctor.  Avoid cat litter boxes and soil used by cats. These carry germs that can cause birth defects in the baby and can cause a loss of your baby (miscarriage) or stillbirth. Contact a doctor if:  You have mild cramps or pressure in your lower belly.  You have pain when you pee (urinate).  You have bad smelling fluid coming from your vagina.  You continue to  feel sick to your stomach (nauseous), throw up (vomit), or have watery poop (diarrhea).  You have a nagging pain in your belly area.  You feel dizzy. Get help right away if:  You have a fever.  You are leaking fluid from your vagina.  You have spotting or bleeding from your vagina.  You have severe belly cramping or pain.  You lose or gain weight rapidly.  You have trouble catching your breath and have chest pain.  You notice sudden or extreme puffiness (swelling) of your face, hands, ankles, feet, or legs.  You have not felt the baby move in over an hour.  You have severe headaches that do not go away when you take medicine.  You have trouble seeing. Summary  The second trimester is from week 14 through week 27 (months 4 through 6). This is often the time in pregnancy that you feel your best.  To take care of yourself and your unborn baby, you will need to eat healthy meals, take medicines only if your doctor tells you to do so, and do activities that are safe for you and your baby.  Call your doctor if you get sick or if you notice anything unusual about your pregnancy. Also, call your doctor if you need help with the right food to eat, or if you want to know what activities are safe for you. This information is not intended to replace advice given to you by your health care provider. Make sure you discuss any questions you have with your health care provider. Document Revised: 12/30/2018 Document Reviewed: 10/13/2016 Elsevier Patient Education  [place]. Common Medications Safe in Pregnancy  Acne:      Constipation:  Benzoyl Peroxide     Colace  Clindamycin      Dulcolax Suppository  Topica Erythromycin     Fibercon  Salicylic Acid      Metamucil         Miralax AVOID:        Senakot   Accutane    Cough:  Retin-A       Cough Drops  Tetracycline      Phenergan w/ Codeine if Rx  Minocycline      Robitussin (Plain &  DM)  Antibiotics:     Crabs/Lice:  Ceclor       RID  Cephalosporins    AVOID:  E-Mycins      Kwell  Keflex  Macrobid/Macrodantin   Diarrhea:  Penicillin      Kao-Pectate  Zithromax      Imodium AD         PUSH FLUIDS AVOID:       Cipro     Fever:  Tetracycline      Tylenol (Regular or Extra  Minocycline       Strength)  Levaquin      Extra Strength-Do not  Exceed 8 tabs/24 hrs Caffeine:        <235m/day (equiv. To 1 cup of coffee or  approx. 3 12 oz sodas)         Gas: Cold/Hayfever:       Gas-X  Benadryl      Mylicon  Claritin       Phazyme  **Claritin-D        Chlor-Trimeton    Headaches:  Dimetapp      ASA-Free Excedrin  Drixoral-Non-Drowsy     Cold Compress  Mucinex (Guaifenasin)     Tylenol (Regular or Extra  Sudafed/Sudafed-12 Hour     Strength)  **Sudafed PE Pseudoephedrine   Tylenol Cold & Sinus     Vicks Vapor Rub  Zyrtec  **AVOID if Problems With Blood Pressure         Heartburn: Avoid lying down for at least 1 hour after meals  Aciphex      Maalox     Rash:  Milk of Magnesia     Benadryl    Mylanta       1% Hydrocortisone Cream  Pepcid  Pepcid Complete   Sleep Aids:  Prevacid      Ambien   Prilosec       Benadryl  Rolaids       Chamomile Tea  Tums (Limit 4/day)     Unisom  Zantac       Tylenol PM         Warm milk-add vanilla or  Hemorrhoids:       Sugar for taste  Anusol/Anusol H.C.  (RX: Analapram 2.5%)  Sugar Substitutes:  Hydrocortisone OTC     Ok in moderation  Preparation H      Tucks        Vaseline lotion applied to tissue with wiping    Herpes:     Throat:  Acyclovir      Oragel  Famvir  Valtrex     Vaccines:         Flu Shot Leg Cramps:       *Gardasil  Benadryl      Hepatitis A         Hepatitis B Nasal Spray:       Pneumovax  Saline Nasal Spray     Polio Booster         Tetanus Nausea:       Tuberculosis test or PPD  Vitamin B6 25 mg TID   AVOID:    Dramamine      *Gardasil  Emetrol       Live  Poliovirus  Ginger Root 250 mg QID    MMR (measles, mumps &  High Complex Carbs @ Bedtime    rebella)  Sea Bands-Accupressure    Varicella (Chickenpox)  Unisom 1/2 tab TID     *No known complications           If received before Pain:         Known pregnancy;   Darvocet       Resume series after  Lortab        Delivery  Percocet    Yeast:   Tramadol      Femstat  Tylenol 3      Gyne-lotrimin  Ultram       Monistat  Vicodin           MISC:         All Sunscreens  Hair Coloring/highlights          Insect Repellant's          (Including DEET)         Mystic Tans  Second Trimester of Pregnancy The second trimester is from week 14 through week 27 (months 4 through 6). The second trimester is often a time when you feel your best. Your body has adjusted to being pregnant, and you begin to feel better physically. Usually, morning sickness has lessened or quit completely, you may have more energy, and you may have an increase in appetite. The second trimester is also a time when the fetus is growing rapidly. At the end of the sixth month, the fetus is about 9 inches long and weighs about 1 pounds. You will likely begin to feel the baby move (quickening) between 16 and 20 weeks of pregnancy. Body changes during your second trimester Your body continues to go through many changes during your second trimester. The changes vary from woman to woman.  Your weight will continue to increase. You will notice your lower abdomen bulging out.  You may begin to get stretch marks on your hips, abdomen, and breasts.  You may develop headaches that can be relieved by medicines. The medicines should be approved by your health care provider.  You may urinate more often because the fetus is pressing on your bladder.  You may develop or continue to have heartburn as a result of your pregnancy.  You may develop constipation because certain hormones are causing the muscles that push waste through your  intestines to slow down.  You may develop hemorrhoids or swollen, bulging veins (varicose veins).  You may have back pain. This is caused by: ? Weight gain. ? Pregnancy hormones that are relaxing the joints in your pelvis. ? A shift in weight and the muscles that support your balance.  Your breasts will continue to grow and they will continue to become tender.  Your gums may bleed and may be sensitive to brushing and flossing.  Dark spots or blotches (chloasma, mask of pregnancy) may develop on your face. This will likely fade after the baby is born.  A dark line from your belly button to the pubic area (linea nigra) may appear. This will likely fade after the baby is born.  You may have changes in your hair. These can include thickening of your hair, rapid growth, and changes in texture. Some women also have hair loss during or after pregnancy, or hair that feels dry or thin. Your hair will most likely return to normal after your baby is born. What to expect at prenatal visits During a routine prenatal visit:  You will be weighed to make sure you and the fetus are growing normally.  Your blood pressure will be taken.  Your abdomen will be measured to track your baby's growth.  The fetal heartbeat will be listened to.  Any test results from the previous visit will be discussed. Your health care provider may ask you:  How you are feeling.  If you are feeling the baby move.  If you have had any abnormal symptoms, such as leaking fluid, bleeding, severe headaches, or abdominal cramping.  If you are using any tobacco products, including cigarettes, chewing tobacco, and electronic cigarettes.  If you have any questions. Other tests that may be performed during your second trimester include:  Blood tests that check for: ? Low iron levels (anemia). ? High blood sugar that affects pregnant women (  gestational diabetes) between 69 and 28 weeks. ? Rh antibodies. This is to check  for a protein on red blood cells (Rh factor).  Urine tests to check for infections, diabetes, or protein in the urine.  An ultrasound to confirm the proper growth and development of the baby.  An amniocentesis to check for possible genetic problems.  Fetal screens for spina bifida and Down syndrome.  HIV (human immunodeficiency virus) testing. Routine prenatal testing includes screening for HIV, unless you choose not to have this test. Follow these instructions at home: Medicines  Follow your health care provider's instructions regarding medicine use. Specific medicines may be either safe or unsafe to take during pregnancy.  Take a prenatal vitamin that contains at least 600 micrograms (mcg) of folic acid.  If you develop constipation, try taking a stool softener if your health care provider approves. Eating and drinking   Eat a balanced diet that includes fresh fruits and vegetables, whole grains, good sources of protein such as meat, eggs, or tofu, and low-fat dairy. Your health care provider will help you determine the amount of weight gain that is right for you.  Avoid raw meat and uncooked cheese. These carry germs that can cause birth defects in the baby.  If you have low calcium intake from food, talk to your health care provider about whether you should take a daily calcium supplement.  Limit foods that are high in fat and processed sugars, such as fried and sweet foods.  To prevent constipation: ? Drink enough fluid to keep your urine clear or pale yellow. ? Eat foods that are high in fiber, such as fresh fruits and vegetables, whole grains, and beans. Activity  Exercise only as directed by your health care provider. Most women can continue their usual exercise routine during pregnancy. Try to exercise for 30 minutes at least 5 days a week. Stop exercising if you experience uterine contractions.  Avoid heavy lifting, wear low heel shoes, and practice good posture.  A  sexual relationship may be continued unless your health care provider directs you otherwise. Relieving pain and discomfort  Wear a good support bra to prevent discomfort from breast tenderness.  Take warm sitz baths to soothe any pain or discomfort caused by hemorrhoids. Use hemorrhoid cream if your health care provider approves.  Rest with your legs elevated if you have leg cramps or low back pain.  If you develop varicose veins, wear support hose. Elevate your feet for 15 minutes, 3-4 times a day. Limit salt in your diet. Prenatal Care  Write down your questions. Take them to your prenatal visits.  Keep all your prenatal visits as told by your health care provider. This is important. Safety  Wear your seat belt at all times when driving.  Make a list of emergency phone numbers, including numbers for family, friends, the hospital, and police and fire departments. General instructions  Ask your health care provider for a referral to a local prenatal education class. Begin classes no later than the beginning of month 6 of your pregnancy.  Ask for help if you have counseling or nutritional needs during pregnancy. Your health care provider can offer advice or refer you to specialists for help with various needs.  Do not use hot tubs, steam rooms, or saunas.  Do not douche or use tampons or scented sanitary pads.  Do not cross your legs for long periods of time.  Avoid cat litter boxes and soil used by cats. These carry germs  that can cause birth defects in the baby and possibly loss of the fetus by miscarriage or stillbirth.  Avoid all smoking, herbs, alcohol, and unprescribed drugs. Chemicals in these products can affect the formation and growth of the baby.  Do not use any products that contain nicotine or tobacco, such as cigarettes and e-cigarettes. If you need help quitting, ask your health care provider.  Visit your dentist if you have not gone yet during your pregnancy. Use  a soft toothbrush to brush your teeth and be gentle when you floss. Contact a health care provider if:  You have dizziness.  You have mild pelvic cramps, pelvic pressure, or nagging pain in the abdominal area.  You have persistent nausea, vomiting, or diarrhea.  You have a bad smelling vaginal discharge.  You have pain when you urinate. Get help right away if:  You have a fever.  You are leaking fluid from your vagina.  You have spotting or bleeding from your vagina.  You have severe abdominal cramping or pain.  You have rapid weight gain or weight loss.  You have shortness of breath with chest pain.  You notice sudden or extreme swelling of your face, hands, ankles, feet, or legs.  You have not felt your baby move in over an hour.  You have severe headaches that do not go away when you take medicine.  You have vision changes. Summary  The second trimester is from week 14 through week 27 (months 4 through 6). It is also a time when the fetus is growing rapidly.  Your body goes through many changes during pregnancy. The changes vary from woman to woman.  Avoid all smoking, herbs, alcohol, and unprescribed drugs. These chemicals affect the formation and growth your baby.  Do not use any tobacco products, such as cigarettes, chewing tobacco, and e-cigarettes. If you need help quitting, ask your health care provider.  Contact your health care provider if you have any questions. Keep all prenatal visits as told by your health care provider. This is important. This information is not intended to replace advice given to you by your health care provider. Make sure you discuss any questions you have with your health care provider. Document Revised: 12/30/2018 Document Reviewed: 10/13/2016 Elsevier Patient Education  [place].    Recurrent Migraine Headache A migraine headache is very bad, throbbing pain that is usually on one side of your head. Recurrent migraines  keep coming back (recurring). Talk with your doctor about what things may bring on (trigger) your migraine headaches. Follow these instructions at home: Medicines  Take over-the-counter and prescription medicines only as told by your doctor.  Do not drive or use heavy machinery while taking prescription pain medicine. Lifestyle  Do not use any products that contain nicotine or tobacco, such as cigarettes and e-cigarettes. If you need help quitting, ask your doctor.  Limit alcohol intake to no more than 1 drink a day for nonpregnant women and 2 drinks a day for men. One drink equals 12 oz of beer, 5 oz of wine, or 1 oz of hard liquor.  Get 7-9 hours of sleep each night.  Lessen any stress in your life. Ask your doctor about ways to lower your stress.  Stay at a healthy weight. Talk with your doctor if you need help losing weight.  Get regular exercise. General instructions   Keep a journal to find out if certain things bring on migraine headaches. For example, write down: ? What you  eat and drink. ? How much sleep you get. ? Any change to your diet or medicines.  Lie down in a dark, quiet room when you have a migraine.  Try placing a cool towel over your head when you have a migraine.  Keep lights dim if bright lights bother you or make your migraines worse.  Keep all follow-up visits as told by your doctor. This is important. Contact a doctor if:  Medicine does not help your migraines.  Your pain keeps coming back.  You have a fever.  You have weight loss without trying. Get help right away if:  Your migraine becomes really bad and medicine does not help.  You have a stiff neck.  You have trouble seeing.  Your muscles are weak or you lose control of your muscles.  You lose your balance or have trouble walking.  You feel like you will pass out (faint) or you pass out.  You have really bad symptoms that are different than your first symptoms.  You start  having sudden, very bad headaches that last for one second or less, like a thunderclap. Summary  A migraine headache is very bad, throbbing pain that is usually on one side of your head.  Talk with your doctor about what things may bring on (trigger) your migraine headaches.  Take over-the-counter and prescription medicines only as told by your doctor.  Lie down in a dark, quiet room when you have a migraine.  Keep a journal about what you eat and drink, how much sleep you get, and any changes to your medicines. This can help you find out if certain things make you have migraine headaches. This information is not intended to replace advice given to you by your health care provider. Make sure you discuss any questions you have with your health care provider. Document Revised: 09/10/2017 Document Reviewed: 07/31/2016 Elsevier Patient Education  2020 [employer].

## 2020-01-03 NOTE — Progress Notes (Signed)
ROB-Pt present for routine prenatal care. Pt stated still having headaches and round ligament pains.

## 2020-01-23 ENCOUNTER — Ambulatory Visit (INDEPENDENT_AMBULATORY_CARE_PROVIDER_SITE_OTHER): Payer: Self-pay | Admitting: Obstetrics and Gynecology

## 2020-01-23 ENCOUNTER — Encounter: Payer: Self-pay | Admitting: Obstetrics and Gynecology

## 2020-01-23 ENCOUNTER — Other Ambulatory Visit: Payer: Self-pay

## 2020-01-23 VITALS — BP 95/61 | HR 96 | Wt 137.7 lb

## 2020-01-23 DIAGNOSIS — Z3A22 22 weeks gestation of pregnancy: Secondary | ICD-10-CM

## 2020-01-23 DIAGNOSIS — Z3402 Encounter for supervision of normal first pregnancy, second trimester: Secondary | ICD-10-CM

## 2020-01-23 DIAGNOSIS — N898 Other specified noninflammatory disorders of vagina: Secondary | ICD-10-CM

## 2020-01-23 LAB — POCT URINALYSIS DIPSTICK OB
Bilirubin, UA: NEGATIVE
Blood, UA: NEGATIVE
Glucose, UA: NEGATIVE
Ketones, UA: NEGATIVE
Leukocytes, UA: NEGATIVE
Nitrite, UA: NEGATIVE
POC,PROTEIN,UA: NEGATIVE
Spec Grav, UA: 1.02 (ref 1.010–1.025)
Urobilinogen, UA: 0.2 E.U./dL
pH, UA: 6 (ref 5.0–8.0)

## 2020-01-23 NOTE — Progress Notes (Signed)
ROB-Pt present for leaking of fluids. Pt c/o vaginal pressure and leaking of fluids x 2 days. Pt stated that it did not smell like urine.

## 2020-01-23 NOTE — Progress Notes (Signed)
Problem OB: Patient complains of possible leaking of fluid since yesterday. Notes the fluid is sometimes clear but other times milky white.  Denies any itching or burning associated.  Speculum exam note moderate thin white discharge. Nitrazine performed, negative. No wet prep done as patient is asymptomatic. Discussed leukorrhea of pregnancy, given reassurancde.  Patient also complains of pelvic pressure. Discussed use of belly band. RTC in 1 week for regularly scheduled OB appointment.

## 2020-01-24 ENCOUNTER — Encounter: Payer: Self-pay | Admitting: Obstetrics and Gynecology

## 2020-01-24 ENCOUNTER — Other Ambulatory Visit: Payer: Self-pay

## 2020-01-24 ENCOUNTER — Inpatient Hospital Stay
Admission: EM | Admit: 2020-01-24 | Discharge: 2020-01-26 | DRG: 833 | Disposition: A | Discharge status: Other Acute Inpatient Hospital | Payer: Medicaid Other | Attending: Obstetrics and Gynecology | Admitting: Obstetrics and Gynecology

## 2020-01-24 ENCOUNTER — Telehealth: Payer: Self-pay

## 2020-01-24 DIAGNOSIS — Z3A22 22 weeks gestation of pregnancy: Secondary | ICD-10-CM

## 2020-01-24 DIAGNOSIS — D573 Sickle-cell trait: Secondary | ICD-10-CM | POA: Diagnosis present

## 2020-01-24 DIAGNOSIS — O42912 Preterm premature rupture of membranes, unspecified as to length of time between rupture and onset of labor, second trimester: Secondary | ICD-10-CM | POA: Diagnosis present

## 2020-01-24 DIAGNOSIS — Z20822 Contact with and (suspected) exposure to covid-19: Secondary | ICD-10-CM | POA: Diagnosis present

## 2020-01-24 DIAGNOSIS — O99012 Anemia complicating pregnancy, second trimester: Secondary | ICD-10-CM | POA: Diagnosis present

## 2020-01-24 DIAGNOSIS — O42012 Preterm premature rupture of membranes, onset of labor within 24 hours of rupture, second trimester: Secondary | ICD-10-CM

## 2020-01-24 DIAGNOSIS — Z3A00 Weeks of gestation of pregnancy not specified: Secondary | ICD-10-CM | POA: Diagnosis not present

## 2020-01-24 DIAGNOSIS — O2690 Pregnancy related conditions, unspecified, unspecified trimester: Secondary | ICD-10-CM | POA: Diagnosis present

## 2020-01-24 DIAGNOSIS — O42112 Preterm premature rupture of membranes, onset of labor more than 24 hours following rupture, second trimester: Secondary | ICD-10-CM

## 2020-01-24 HISTORY — DX: Preterm premature rupture of membranes, onset of labor within 24 hours of rupture, second trimester: O42.012

## 2020-01-24 LAB — WET PREP, GENITAL
Clue Cells Wet Prep HPF POC: NONE SEEN
Sperm: NONE SEEN
Trich, Wet Prep: NONE SEEN
Yeast Wet Prep HPF POC: NONE SEEN

## 2020-01-24 LAB — TYPE AND SCREEN
ABO/RH(D): O POS
Antibody Screen: NEGATIVE

## 2020-01-24 LAB — URINALYSIS, ROUTINE W REFLEX MICROSCOPIC
Bilirubin Urine: NEGATIVE
Glucose, UA: NEGATIVE mg/dL
Hgb urine dipstick: NEGATIVE
Ketones, ur: 20 mg/dL — AB
Leukocytes,Ua: NEGATIVE
Nitrite: NEGATIVE
Protein, ur: NEGATIVE mg/dL
Specific Gravity, Urine: 1.01 (ref 1.005–1.030)
pH: 5 (ref 5.0–8.0)

## 2020-01-24 LAB — CBC
HCT: 27.4 % — ABNORMAL LOW (ref 36.0–46.0)
Hemoglobin: 9.4 g/dL — ABNORMAL LOW (ref 12.0–15.0)
MCH: 33 pg (ref 26.0–34.0)
MCHC: 34.3 g/dL (ref 30.0–36.0)
MCV: 96.1 fL (ref 80.0–100.0)
Platelets: 230 10*3/uL (ref 150–400)
RBC: 2.85 MIL/uL — ABNORMAL LOW (ref 3.87–5.11)
RDW: 13.1 % (ref 11.5–15.5)
WBC: 13.8 10*3/uL — ABNORMAL HIGH (ref 4.0–10.5)
nRBC: 0 % (ref 0.0–0.2)

## 2020-01-24 LAB — RAPID HIV SCREEN (HIV 1/2 AB+AG)
HIV 1/2 Antibodies: NONREACTIVE
HIV-1 P24 Antigen - HIV24: NONREACTIVE

## 2020-01-24 LAB — RESPIRATORY PANEL BY RT PCR (FLU A&B, COVID)
Influenza A by PCR: NEGATIVE
Influenza B by PCR: NEGATIVE
SARS Coronavirus 2 by RT PCR: NEGATIVE

## 2020-01-24 LAB — ABO/RH: ABO/RH(D): O POS

## 2020-01-24 LAB — RUPTURE OF MEMBRANE (ROM)PLUS: Rom Plus: POSITIVE

## 2020-01-24 MED ORDER — DOCUSATE SODIUM 100 MG PO CAPS
100.0000 mg | ORAL_CAPSULE | Freq: Every day | ORAL | Status: DC
  Administered 2020-01-25 – 2020-01-26 (×2): 100 mg via ORAL
  Filled 2020-01-24 (×2): qty 1

## 2020-01-24 MED ORDER — CALCIUM CARBONATE ANTACID 500 MG PO CHEW: 2.0000 | CHEWABLE_TABLET | ORAL | Status: DC | PRN

## 2020-01-24 MED ORDER — AZITHROMYCIN 500 MG PO TABS
1000.0000 mg | ORAL_TABLET | Freq: Once | ORAL | Status: AC
  Administered 2020-01-24: 1000 mg via ORAL
  Filled 2020-01-24: qty 2

## 2020-01-24 MED ORDER — SODIUM CHLORIDE 0.9 % IV SOLN
2.0000 g | Freq: Four times a day (QID) | INTRAVENOUS | Status: AC
  Administered 2020-01-24 – 2020-01-26 (×8): 2 g via INTRAVENOUS
  Filled 2020-01-24 (×9): qty 2000
  Filled 2020-01-24: qty 2
  Filled 2020-01-24: qty 2000

## 2020-01-24 MED ORDER — ZOLPIDEM TARTRATE 5 MG PO TABS: 5.0000 mg | ORAL_TABLET | Freq: Every evening | ORAL | Status: DC | PRN

## 2020-01-24 MED ORDER — PRENATAL MULTIVITAMIN CH
1.0000 | ORAL_TABLET | Freq: Every day | ORAL | Status: DC
  Administered 2020-01-25: 1 via ORAL
  Filled 2020-01-24: qty 1

## 2020-01-24 MED ORDER — AMOXICILLIN 500 MG PO CAPS
500.0000 mg | ORAL_CAPSULE | Freq: Three times a day (TID) | ORAL | Status: DC
  Filled 2020-01-24 (×2): qty 1

## 2020-01-24 MED ORDER — LACTATED RINGERS IV SOLN: INTRAVENOUS | Status: DC

## 2020-01-24 MED ORDER — ACETAMINOPHEN 325 MG PO TABS: 650.0000 mg | ORAL_TABLET | ORAL | Status: DC | PRN

## 2020-01-24 NOTE — Telephone Encounter (Signed)
Pt called and informed that [facility] suggested that pt go to the ED to L&D to be seen due to possible leaking of fluids. Pt stated that she was already at the ED at this time.

## 2020-01-24 NOTE — H&P (Addendum)
Obstetric History and Physical  Helen Schneider is a 29 y.o. G1P0 with IUP at [redacted]w[redacted]d presenting for complaints of leaking fluid (increased vaginal discharge) for several days.  Patient was seen yesterday in clinic for similar complaint, ruled out for ruptured membranes with speculum exam and nitrazine test.  Patient reports today she had an increased amount of discharge and associated spotting/light bleeding. Patient states she has been having  No contractions, minimal vaginal bleeding, ruptured, clear fluid membranes, with active fetal movement.    Prenatal Course Source of Care: [facility] with onset of care at 9 weeks Pregnancy complications or risks: Patient Active Problem List   Diagnosis Date Noted  . Preterm premature rupture of membranes (PPROM) with onset of labor within 24 hours of rupture in second trimester, antepartum 01/24/2020  . Sickle cell trait (HCC) 11/08/2019  . Anxiety and depression 05/16/2018     Prenatal labs and studies: ABO, Rh: O/Positive/-- (02/01 1610) Antibody: Negative (02/01 0904) Rubella: 12.20 (02/01 0904) RPR: Non Reactive (02/01 0904)  HBsAg: Negative (02/01 0904)  HIV: Non Reactive (02/01 0904)  GBS: unknown 1 hr Glucola not yet performed.  Genetic screening normal Anatomy US normal   Past Medical History:  Diagnosis Date  . Constipation 2016  . IBS (irritable bowel syndrome) 2016  . Iron deficiency anemia     Past Surgical History:  Procedure Laterality Date  . WISDOM TOOTH EXTRACTION  2013    OB History  Gravida Para Term Preterm AB Living  1            SAB TAB Ectopic Multiple Live Births               # Outcome Date GA Lbr Len/2nd Weight Sex Delivery Anes PTL Lv  1 Current             Social History   Socioeconomic History  . Marital status: Married    Spouse name: Do'trell  . Number of children: 0  . Years of education: Not on file  . Highest education level: Not on file  Occupational History  . Not on file   Tobacco Use  . Smoking status: Never Smoker  . Smokeless tobacco: Never Used  Substance and Sexual Activity  . Alcohol use: Never  . Drug use: Never  . Sexual activity: Yes    Birth control/protection: None  Other Topics Concern  . Not on file  Social History Narrative  . Not on file   Social Determinants of Health   Financial Resource Strain:   . Difficulty of Paying Living Expenses:   Food Insecurity:   . Worried About Programme researcher, broadcasting/film/video in the Last Year:   . Barista in the Last Year:   Transportation Needs:   . Freight forwarder (Medical):   Marland Kitchen Lack of Transportation (Non-Medical):   Physical Activity:   . Days of Exercise per Week:   . Minutes of Exercise per Session:   Stress:   . Feeling of Stress :   Social Connections:   . Frequency of Communication with Friends and Family:   . Frequency of Social Gatherings with Friends and Family:   . Attends Religious Services:   . Active Member of Clubs or Organizations:   . Attends Banker Meetings:   Marland Kitchen Marital Status:     Family History  Problem Relation Age of Onset  . Multiple sclerosis Father   . Lung cancer Maternal Grandfather   . Breast  cancer Neg Hx   . Ovarian cancer Neg Hx   . Colon cancer Neg Hx     Medications Prior to Admission  Medication Sig Dispense Refill Last Dose  . Ascorbic Acid (VITAMIN C) 100 MG tablet Take 100 mg by mouth daily.   01/24/2020 at Unknown time  . ferrous sulfate 325 (65 FE) MG EC tablet Take 1 tablet (325 mg total) by mouth daily with breakfast. 90 tablet 1 01/24/2020 at Unknown time  . Prenatal Vit-Fe Fumarate-FA (MULTIVITAMIN-PRENATAL) 27-0.8 MG TABS tablet Take 1 tablet by mouth daily at 12 noon.   01/24/2020 at Unknown time  . Probiotic Product (PROBIOTIC PO) Take by mouth.   Past Month at Unknown time    Allergies  Allergen Reactions  . Kiwi Extract   . Annye Forrey Hives  . Mushroom Extract Complex Hives    Review of Systems: Negative except for what  is mentioned in HPI.  Physical Exam: BP 109/72 (BP Location: Left Arm)   Pulse 85   Temp 98.4 F (36.9 C) (Oral)   Resp 18   Ht 5\' 6"  (1.676 m)   Wt 62.5 kg   LMP 08/20/2019 (Exact Date)   BMI 22.24 kg/m  CONSTITUTIONAL: Well-developed, well-nourished female in no acute distress.  HENT:  Normocephalic, atraumatic, External right and left ear normal. Oropharynx is clear and moist EYES: Conjunctivae and EOM are normal. Pupils are equal, round, and reactive to light. No scleral icterus.  NECK: Normal range of motion, supple, no masses SKIN: Skin is warm and dry. No rash noted. Not diaphoretic. No erythema. No pallor. NEUROLOGIC: Alert and oriented to person, place, and time. Normal reflexes, muscle tone coordination. No cranial nerve deficit noted. PSYCHIATRIC: Normal mood and affect. Normal behavior. Normal judgment and thought content. CARDIOVASCULAR: Normal heart rate noted, regular rhythm RESPIRATORY: Effort and breath sounds normal, no problems with respiration noted ABDOMEN: Soft, nontender, nondistended, gravid. MUSCULOSKELETAL: Normal range of motion. No edema and no tenderness. 2+ distal pulses.  Cervical Exam: Not performed.  Closed on speculum exam yesterday in office.   FHT:  Baseline rate 155 bpm   Variability moderate  Accelerations present   Decelerations none Contractions: None. Uterine irritability noted.   Pertinent Labs/Studies:   Results for orders placed or performed during the hospital encounter of 01/24/20 (from the past 24 hour(s))  ROM Plus ([facility] only)     Status: None   Collection Time: 01/24/20  4:04 PM  Result Value Ref Range   Rom Plus POSITIVE   Wet prep, genital     Status: Abnormal   Collection Time: 01/24/20  4:04 PM  Result Value Ref Range   Yeast Wet Prep HPF POC NONE SEEN NONE SEEN   Trich, Wet Prep NONE SEEN NONE SEEN   Clue Cells Wet Prep HPF POC NONE SEEN NONE SEEN   WBC, Wet Prep HPF POC MODERATE (A) NONE SEEN   Sperm NONE SEEN      Assessment : Helen Schneider is a 29 y.o. G1P0 at [redacted]w[redacted]d being admitted for PPROM in second trimester  Plan: 1. Admit to Labor and Delivery, will transition to antepartum service as patient is not actively contracting, no signs of labor.  2. Will start antibiotics for latency.  3. Lengthy discussion had with patient regarding outcomes with PPROM at pre-viability.  Discussed that ~ 15% of pregnancies will go on to reseal membranes and continue a normal pregnancy.  Majority will deliver within 2 weeks of ruptured membranes.  As patient close to  gestational age of viability, will continue to monitor and can treat with antenatal steroids once approaching 23 weeks of gestation. At this time, can also transfer to tertiary care center (patient prefers [facility]) to continue care and pregnancy, as they have increase ability for resuscitation of a significantly preterm fetus.  4. SCDs for DVT prophylaxis.  5. Regular diet.  6. Activity: Bedrest with bathroom privileges.  7. Ultrasound ordered to assess fetal weight and AFI.  8. Neonatology to be consulted once patient reaches closer to gfestational age of viability.    Hildred Laser, MD [facility]

## 2020-01-24 NOTE — OB Triage Note (Signed)
Pt G1P0 [redacted]w[redacted]d [facility] through [facility]. Complains of LOF and vaginal bleeding. States fluid was large amount with yellow/clearish tint, stated bleeding was "more than spotting, less than period flow". VSS. Reports good fetal movement.

## 2020-01-25 ENCOUNTER — Inpatient Hospital Stay: Payer: Medicaid Other

## 2020-01-25 LAB — CHLAMYDIA/NGC RT PCR (ARMC ONLY)
Chlamydia Tr: NOT DETECTED
N gonorrhoeae: NOT DETECTED

## 2020-01-25 LAB — RPR: RPR Ser Ql: NONREACTIVE

## 2020-01-25 MED ORDER — INDOMETHACIN 50 MG PO CAPS
50.0000 mg | ORAL_CAPSULE | Freq: Once | ORAL | Status: AC
  Administered 2020-01-25: 50 mg via ORAL
  Filled 2020-01-25: qty 1

## 2020-01-25 MED ORDER — TERBUTALINE SULFATE 1 MG/ML IJ SOLN
INTRAMUSCULAR | Status: AC
  Filled 2020-01-25: qty 1

## 2020-01-25 MED ORDER — BISACODYL 10 MG RE SUPP
10.0000 mg | Freq: Every day | RECTAL | Status: DC | PRN
  Administered 2020-01-25: 10 mg via RECTAL
  Filled 2020-01-25 (×2): qty 1

## 2020-01-25 MED ORDER — FENTANYL CITRATE (PF) 100 MCG/2ML IJ SOLN
50.0000 ug | INTRAMUSCULAR | Status: DC | PRN
  Administered 2020-01-25 (×3): 50 ug via INTRAVENOUS
  Administered 2020-01-26 (×2): 100 ug via INTRAVENOUS
  Filled 2020-01-25 (×4): qty 2

## 2020-01-25 MED ORDER — LACTATED RINGERS IV BOLUS
500.0000 mL | Freq: Once | INTRAVENOUS | Status: AC
  Administered 2020-01-25: 500 mL via INTRAVENOUS

## 2020-01-25 MED ORDER — TERBUTALINE SULFATE 1 MG/ML IJ SOLN
0.2500 mg | INTRAMUSCULAR | Status: DC | PRN
  Administered 2020-01-25 (×2): 0.25 mg via SUBCUTANEOUS
  Filled 2020-01-25: qty 1

## 2020-01-25 MED ORDER — FENTANYL CITRATE (PF) 100 MCG/2ML IJ SOLN
INTRAMUSCULAR | Status: AC
  Filled 2020-01-25: qty 2

## 2020-01-25 MED ORDER — INDOMETHACIN 25 MG PO CAPS
25.0000 mg | ORAL_CAPSULE | Freq: Three times a day (TID) | ORAL | Status: DC
  Filled 2020-01-25: qty 1

## 2020-01-25 NOTE — Progress Notes (Signed)
Pt called out complaining on bad lower abdominal pain rating 8 out of 10. RN went in to room to assess pt noticed tightening of lower abdomen intermittently with pain. Dr. Valentino Saxon notified. Orders given to transfer pt to labor and delivery. Pt transferred to labor and delivery unit at this time. Report given to L&D nurse Gordy Clement, RN.    Oswald Hillock, RN

## 2020-01-25 NOTE — Progress Notes (Addendum)
Antenatal Progress Note  Subjective:     Patient ID: Helen Schneider is a 29 y.o. female [redacted]w[redacted]d, Estimated Date of Delivery: 05/27/20 by Patient's last menstrual period was 08/20/2019 (exact date), consistent with 1st trimester sono who was admitted for PPROM at [redacted] weeks gestation.  HD# 1.   Subjective:  Patient reports feeling crampy/contraction-like pain for the past 30 minutes. Pain occurs every few minutes. Initially thought it might be bladder spasms.  Patient does report recently having a bowel movement as well.    Review of Systems Denies contractions, vaginal bleeding, and reports good fetal movement.     Objective:   Vitals:   01/24/20 2357 01/25/20 0608 01/25/20 0835 01/25/20 1629  BP:  104/63 97/67 107/68  Pulse: 90 79 79 85  Resp:   18 18  Temp: 98.8 F (37.1 C) 98.5 F (36.9 C) 98.7 F (37.1 C) 98.5 F (36.9 C)  TempSrc: Oral Oral Oral Oral  SpO2: 100% 100% 100% 100%  Weight:      Height:        General appearance: alert and mild to moderate distress when pain occurs. Visibly occuring every 3-4 minutes  Abdomen: soft, intermittent tightening of the abdomen noted by nurse  Pelvic: Deferred Extremities: extremities normal, atraumatic, no cyanosis or edema   FHT: 156 bpm Toco: uterine irritability present.    Labs:  Results for orders placed or performed during the hospital encounter of 01/24/20  Wet prep, genital  Result Value Ref Range   Yeast Wet Prep HPF POC NONE SEEN NONE SEEN   Trich, Wet Prep NONE SEEN NONE SEEN   Clue Cells Wet Prep HPF POC NONE SEEN NONE SEEN   WBC, Wet Prep HPF POC MODERATE (A) NONE SEEN   Sperm NONE SEEN   Respiratory Panel by RT PCR (Flu A&B, Covid) - Nasopharyngeal Swab   Specimen: Nasopharyngeal Swab  Result Value Ref Range   SARS Coronavirus 2 by RT PCR NEGATIVE NEGATIVE   Influenza A by PCR NEGATIVE NEGATIVE   Influenza B by PCR NEGATIVE NEGATIVE  Chlamydia/NGC rt PCR ([facility] only)   Specimen: Urine  Result Value Ref Range    Specimen source GC/Chlam URINE, RANDOM    Chlamydia Tr NOT DETECTED NOT DETECTED   N gonorrhoeae NOT DETECTED NOT DETECTED  ROM Plus ([facility] only)  Result Value Ref Range   Rom Plus POSITIVE   CBC  Result Value Ref Range   WBC 13.8 (H) 4.0 - 10.5 K/uL   RBC 2.85 (L) 3.87 - 5.11 MIL/uL   Hemoglobin 9.4 (L) 12.0 - 15.0 g/dL   HCT 16.1 (L) 09.6 - 04.5 %   MCV 96.1 80.0 - 100.0 fL   MCH 33.0 26.0 - 34.0 pg   MCHC 34.3 30.0 - 36.0 g/dL   RDW 40.9 81.1 - 91.4 %   Platelets 230 150 - 400 K/uL   nRBC 0.0 0.0 - 0.2 %  Rapid HIV screen (HIV 1/2 Ab+Ag)  Result Value Ref Range   HIV-1 P24 Antigen - HIV24 NON REACTIVE NON REACTIVE   HIV 1/2 Antibodies NON REACTIVE NON REACTIVE   Interpretation (HIV Ag Ab)      A non reactive test result means that HIV 1 or HIV 2 antibodies and HIV 1 p24 antigen were not detected in the specimen.  RPR  Result Value Ref Range   RPR Ser Ql NON REACTIVE NON REACTIVE  Urinalysis, Routine w reflex microscopic  Result Value Ref Range   Color, Urine STRAW (A)  YELLOW   APPearance CLEAR (A) CLEAR   Specific Gravity, Urine 1.010 1.005 - 1.030   pH 5.0 5.0 - 8.0   Glucose, UA NEGATIVE NEGATIVE mg/dL   Hgb urine dipstick NEGATIVE NEGATIVE   Bilirubin Urine NEGATIVE NEGATIVE   Ketones, ur 20 (A) NEGATIVE mg/dL   Protein, ur NEGATIVE NEGATIVE mg/dL   Nitrite NEGATIVE NEGATIVE   Leukocytes,Ua NEGATIVE NEGATIVE  Type and screen [facility]  Result Value Ref Range   ABO/RH(D) O POS    Antibody Screen NEG    Sample Expiration      01/27/2020,2359 Performed at [facility], [address], [place], [place] [address]   ABO/Rh  Result Value Ref Range   ABO/RH(D)      O POS Performed at [facility], [address] [place], [place] [address]      Imaging:  US OB Comp + 14 Wk CLINICAL DATA:  Premature rupture of membranes. Evaluate fetus. Patient is 22 weeks 4 days by assigned EDC of 05/26/2020.  EXAM: OBSTETRIC  14+ WK ULTRASOUND FOLLOW-UP  FINDINGS: Number of Fetuses: 1  Heart Rate:  167 bpm  Movement: Present  Presentation: Cephalic  Previa: None  Placental Location: Posterior  Amniotic Fluid (Subjective): Normal  Amniotic Fluid (Objective):  Vertical pocket 7.3cm  FETAL BIOMETRY  BPD:  5.4cm 22w 3d  HC:    20.1cm 22w 2d  AC:    17.2cm 22w 1d  FL:    3.8cm 22w 1d  Current Mean GA: 21w 5d [place] EDC: 05/28/2020  Assigned GA: 22w 4d Assigned EDC: 05/26/2020  FETAL ANATOMY  Lateral Ventricles: Appears normal  Thalami/CSP: Appears normal  Posterior Fossa: Not visualized  Nuchal Region: Not applicable  Upper Lip: Not visualized  Spine: Not visualized  4 Chamber Heart on Left: Appears normal  LVOT: Not visualized  RVOT: Not visualized  Stomach on Left: Appears normal  3 Vessel Cord: Not visualized  Cord Insertion site: Not visualized  Kidneys: Appears normal  Bladder: Appears normal  Extremities: Not visualized  Sex: Not Visualized  Technical Limitations: None; limited anatomic survey secondary to patient's status.  Maternal Findings:  Cervix:  Cervical dilatation and funneling.  IMPRESSION: 1. Single living intrauterine fetus in cephalic presentation. 2. Size and dates correlate well. 3. Amniotic fluid volume is subjectively and objectively within normal limits. 4. Cervix is dilated and shows funneling.  These results were called by telephone at the time of interpretation on 01/25/2020 at 9:11 Am to provider [facility] , who verbally acknowledged these results.  Electronically Signed   By: Norva Pavlov M.D.   On: 01/25/2020 09:11   Assessment:  29 y.o. female [redacted]w[redacted]d, with:    1. Preterm premature rupture of membranes (PPROM) with onset of labor after 24 hours of rupture in second trimester, antepartum       Plan:   1. Patient brought to Labor and Delivery for further monitoring.  2. Bedside ultrasound still notes fetus mainly in  abdominal region, oblique presentation with head to maternal right, also with 1 foot noted in vagina. Funneling membranes visible on scan.  3. Discussion had with patient that we can attempt to stop labor as she currently shows no signs of infection, however if her body progresses beyond medications, there are no other interventions available to arrest labor. Will adminster a dose of terbutaline. Also given Fentanyl for comfort.  4. Is s/p Neonatology consultation, who also has informed patient that due to current gestational age, no  resuscitative measures would be able to be given.  5. To continue antibiotics for latency.    Rubie Maid, MD [facility]

## 2020-01-25 NOTE — Progress Notes (Signed)
   01/25/20 1050  Clinical Encounter Type  Visited With Patient and family together  Visit Type Initial;Spiritual support  Referral From Nurse  Consult/Referral To Chaplain  Chaplain stopped in to check on patient. Chaplain asked patient how she was feeling and she said anxious. Chaplain encouraged her to calming down and relaxing. Chaplain held a brief conversation with Helen Schneider and her husband Helen Schneider. Chaplain asked if she could pray with them and patient said yes. Chaplain prayed and told couple chaplain availability and encouraged them to be hopeful. Chaplain will follow up.

## 2020-01-25 NOTE — Progress Notes (Signed)
[facility] from [facility]B d/t pt c/o CTX

## 2020-01-25 NOTE — Progress Notes (Addendum)
  Antenatal Progress Note  Subjective:     Patient ID: Kameela Bittel is a 29 y.o. female [redacted]w[redacted]d, Estimated Date of Delivery: 05/27/20 by Patient's last menstrual period was 08/20/2019 (exact date), consistent with 1st trimester sono who was admitted for PPROM at [redacted] weeks gestation.  HD# 1.   Subjective:  Patient reports complaints of feeling the need to "poop".  Noted that when going to the restroom last night she saw something come out (fluid filled sac). It went back inside once she got back in the bed. Still leaking some fluid.   Review of Systems Denies contractions, vaginal bleeding, and reports good fetal movement.     Objective:   Vitals:   01/24/20 1932 01/24/20 2217 01/24/20 2357 01/25/20 0608  BP: 112/73 103/63 (!) 96/59 104/63  Pulse: 84 90 90 79  Resp: 17     Temp: 98.4 F (36.9 C) 98.9 F (37.2 C) 98.8 F (37.1 C) 98.5 F (36.9 C)  TempSrc: Oral Oral Oral Oral  SpO2:  100% 100% 100%  Weight:      Height:        General appearance: alert and no distress Lungs: clear to auscultation bilaterally Heart: regular rate and rhythm, S1, S2 normal, no murmur, click, rub or gallop Abdomen: soft, non-tender; bowel sounds normal; no masses,  no organomegaly Pelvic: Speculum exam performed, bulging membranes present in vagina.  Extremities: extremities normal, atraumatic, no cyanosis or edema   FHT: 157 bpm Toco: no contractions   Assessment:  29 y.o. female [redacted]w[redacted]d, with:    1. Preterm premature rupture of membranes (PPROM) with onset of labor after 24 hours of rupture in second trimester, antepartum        Plan:   1. Continue routine antenatal care 2. Pending ultrasound to verify weight, presentation, AFI 3. Strict bedrest, placed in Trendelenburg positioning.  4. Continue SCDs for DVT prophylaxis.  5. Neonatology consult if infant achieves viability.  6. Continue antibiotics for latency.  7. Plan for antenatal steroids once approaching viability.  8. Continue to  monitor.    Hildred Laser, MD [facility]

## 2020-01-25 NOTE — Progress Notes (Signed)
Helped pt to restroom around 2230. Pt felt like she had to have a BM. After sitting on the toilet for a few minutes she said "I feel like something is going to come out of my vagina". Nurse explained to pt not to strain. Nurse checked and you could see a fluid-filled sack hanging out of her vagina approximately 2 inches. Only blood on toilet paper when wiping.   Got pt back to bed and called to notify Dr. Valentino Saxon.   Instructed by Dr. Valentino Saxon for pt to be bedrest at all times and to use a bedpan.   Nurse rechecked pt around 0000 and what was visible coming from the vagina is no longer visible at all.    Pt and her partner are very anxious. Nurse explained remaining in bed is most important right now. Pt requested fetal heart tones to be completed and they were found almost immediately with a rate of 157.   Explained to pt and her partner to notify nurse if anything changes or she feels anything different regarding signs/symptoms. VSS and pt still receiving continuous fluids and ampicillin IVPB every 6 hours.

## 2020-01-25 NOTE — Consult Note (Signed)
Neonatology Consult Note:  I spoke with Ms. Helen Schneider who is a G1P0 at 22 weeks 2 days gestation with a singleton pregnancy.  Ms. Helen Schneider presented to L&D due to PPROM.  Counseled Ms. Helen Schneider regarding a fetus at [redacted] weeks gestation and informed her that no resuscitative efforts would be made until she is [redacted] weeks gestation.  I also instructed her that if she does go into labor in the near future that delivery at a tertiary care center is preferred. All questions were answered and Ms. Helen Schneider verbalized understanding.  Thank you for this consult.

## 2020-01-26 ENCOUNTER — Ambulatory Visit (HOSPITAL_COMMUNITY)
Admission: AD | Admit: 2020-01-26 | Discharge: 2020-01-26 | Disposition: A | Discharge status: Home / Self Care | Payer: Medicaid Other | Source: Other Acute Inpatient Hospital | Attending: Obstetrics and Gynecology | Admitting: Obstetrics and Gynecology

## 2020-01-26 DIAGNOSIS — O42012 Preterm premature rupture of membranes, onset of labor within 24 hours of rupture, second trimester: Principal | ICD-10-CM

## 2020-01-26 DIAGNOSIS — Z3A22 22 weeks gestation of pregnancy: Secondary | ICD-10-CM

## 2020-01-26 DIAGNOSIS — O2690 Pregnancy related conditions, unspecified, unspecified trimester: Secondary | ICD-10-CM | POA: Insufficient documentation

## 2020-01-26 DIAGNOSIS — Z3A00 Weeks of gestation of pregnancy not specified: Secondary | ICD-10-CM | POA: Insufficient documentation

## 2020-01-26 MED ORDER — INDOMETHACIN 50 MG PO CAPS
50.0000 mg | ORAL_CAPSULE | ORAL | Status: DC
  Administered 2020-01-26 (×5): 50 mg via ORAL
  Filled 2020-01-26 (×9): qty 1

## 2020-01-26 MED ORDER — BETAMETHASONE SOD PHOS & ACET 6 (3-3) MG/ML IJ SUSP
12.0000 mg | Freq: Once | INTRAMUSCULAR | Status: AC
  Administered 2020-01-26: 12 mg via INTRAMUSCULAR
  Filled 2020-01-26: qty 2
  Filled 2020-01-26: qty 5

## 2020-01-26 NOTE — Discharge Summary (Addendum)
Patient ID: Helen Schneider, female   DOB: Feb 28, 1991, 29 y.o.   MRN: [id number]     Discharge/transfer summary note  SUBJECTIVE Helen Schneider is a 29 y.o. G1P0 female at [redacted]w[redacted]d, EDD Estimated Date of Delivery: 05/26/20 Patient seen immediately prior to transport.  Comfortable.  Denies contractions.  Voiced understanding regarding transport.  Risk benefits reviewed in detail.  Patient agrees with transport.  All questions answered of patient and family.  OB History  Gravida Para Term Preterm AB Living  1 0 0 0 0 0  SAB TAB Ectopic Multiple Live Births  0 0 0 0 0    # Outcome Date GA Lbr Len/2nd Weight Sex Delivery Anes PTL Lv  1 Current             Medications Prior to Admission  Medication Sig Dispense Refill Last Dose  . Ascorbic Acid (VITAMIN C) 100 MG tablet Take 100 mg by mouth daily.   01/24/2020 at Unknown time  . ferrous sulfate 325 (65 FE) MG EC tablet Take 1 tablet (325 mg total) by mouth daily with breakfast. 90 tablet 1 01/24/2020 at Unknown time  . Prenatal Vit-Fe Fumarate-FA (MULTIVITAMIN-PRENATAL) 27-0.8 MG TABS tablet Take 1 tablet by mouth daily at 12 noon.   01/24/2020 at Unknown time  . Probiotic Product (PROBIOTIC PO) Take by mouth.   Past Month at Unknown time     OBJECTIVE  Nursing Evaluation:   BP 115/75 (BP Location: Right Arm)   Pulse 90   Temp 98.5 F (36.9 C) (Oral)   Resp 18   Ht 5\' 6"  (1.676 m)   Wt 62.5 kg   LMP 08/20/2019 (Exact Date)   SpO2 100%   BMI 22.24 kg/m    Findings:         Positive fetal heart tones appropriate for gestational age.      Rare contractions      Patient stable on Indocin with 22 weeks spontaneous rupture membranes.      NST INTERPRETATION: Appropriate for gestational age  Mode: Doppler Baseline Rate (A): 164 bpm           Contraction Frequency (min): ui  ASSESSMENT Impression:  1.  Pregnancy:  G1P0 at [redacted]w[redacted]d , EDD Estimated Date of Delivery: 05/26/20 2.  Reassuring fetal and maternal status 3.  22-week spontaneous  rupture membranes-high leak. 4.  Patient has received:  Antibiotics, Indocin, betamethasone/ 5.  UA, GC/CT, and remainder of work-up negative for causation.  PLAN 1. Discussed current condition and above findings with patient.  All questions answered. 2.  Transfer to [facility]-attending physician smalls, MD.

## 2020-01-26 NOTE — Progress Notes (Signed)
RN on phone with [facility] to set up transfer to [facility] (at 1900, [facility] transport called nurses' unit to say they would be unable to get this patient until "tomorrow"); per [facility], the transport team will be at [facility] in 30-45 minutes

## 2020-01-26 NOTE — Progress Notes (Signed)
Pt being transferred at this time; pt on stretcher and going with transport team; family present and will follow transport team; pt going to [facility] hospital

## 2020-01-26 NOTE — Progress Notes (Signed)
RN on the phone with [employer]ero Fellers, member of the [facility] transport team; RN giving report to [employer] on transport team

## 2020-01-26 NOTE — Progress Notes (Signed)
RN called to [facility] L&D to discuss what room and give report; RN spoke to the charge nurse and gave general information/report to charge nurse; charge nurse wanted RN to call [employer]; pt will go through triage when she gets to [employer]

## 2020-01-26 NOTE — Progress Notes (Signed)
RN on phone with [employer]; a facesheet was faxed to [facility] transport center; RN let [facility] transport center know that the transport team is currently here and preparing for transport to [facility]

## 2020-01-26 NOTE — Progress Notes (Signed)
Transport team arrived to unit; Dr. Logan Bores present finishing rounding on pt; RN to print paper work for pt to sign and paper work for transport team; VS taken and FHT done on pt

## 2020-01-26 NOTE — Progress Notes (Signed)
Antenatal Progress Note  Subjective:     Patient ID: Helen Schneider is a 29 y.o. female [redacted]w[redacted]d, Estimated Date of Delivery: 05/27/20 by Patient's last menstrual period was 08/20/2019 (exact date), consistent with 1st trimester sono who was admitted for PPROM at 22.[redacted] weeks gestation.  HD# 2.   Subjective:  Patient notes feeling much better. Had contractions overnight treated with IVF, terbutaline, and was initiated on Indocin.    Review of Systems Denies contractions, vaginal bleeding, and reports good fetal movement.     Objective:   Vitals:   01/26/20 0037 01/26/20 0105 01/26/20 0304 01/26/20 0741  BP:  (!) 89/55  (!) 91/56  Pulse:  (!) 108  81  Resp:    16  Temp: 98.7 F (37.1 C)  98.6 F (37 C) 98.3 F (36.8 C)  TempSrc: Oral  Oral Oral  SpO2: 100% 97%    Weight:      Height:        General appearance: alert and no distress  Lungs: clear to auscultation bilaterally Heart: regular rate and rhythm, S1, S2 normal, no murmur, click, rub or gallop Abdomen: soft, non-tender. Gravid Pelvic: Deferred Extremities: extremities normal, atraumatic, no cyanosis or edema   FHT: 158 bpm Toco: uterine irritability present.    Labs:  Results for orders placed or performed during the hospital encounter of 01/24/20  Wet prep, genital  Result Value Ref Range   Yeast Wet Prep HPF POC NONE SEEN NONE SEEN   Trich, Wet Prep NONE SEEN NONE SEEN   Clue Cells Wet Prep HPF POC NONE SEEN NONE SEEN   WBC, Wet Prep HPF POC MODERATE (A) NONE SEEN   Sperm NONE SEEN   Culture, beta strep (group b only)   Specimen: Vaginal/Rectal; Genital  Result Value Ref Range   Specimen Description      VAGINAL/RECTAL Performed at [facility], [address] [place], [place] [address]    Special Requests      NONE Performed at [facility], [address] [place], [place] [address]    Culture      CULTURE REINCUBATED FOR BETTER GROWTH Performed at [facility], [place]  [address], [place], [place] [address]    Report Status PENDING   Respiratory Panel by RT PCR (Flu A&B, Covid) - Nasopharyngeal Swab   Specimen: Nasopharyngeal Swab  Result Value Ref Range   SARS Coronavirus 2 by RT PCR NEGATIVE NEGATIVE   Influenza A by PCR NEGATIVE NEGATIVE   Influenza B by PCR NEGATIVE NEGATIVE  Chlamydia/NGC rt PCR ([facility] only)   Specimen: Urine  Result Value Ref Range   Specimen source GC/Chlam URINE, RANDOM    Chlamydia Tr NOT DETECTED NOT DETECTED   N gonorrhoeae NOT DETECTED NOT DETECTED  ROM Plus ([facility] only)  Result Value Ref Range   Rom Plus POSITIVE   CBC  Result Value Ref Range   WBC 13.8 (H) 4.0 - 10.5 K/uL   RBC 2.85 (L) 3.87 - 5.11 MIL/uL   Hemoglobin 9.4 (L) 12.0 - 15.0 g/dL   HCT 27.4 (L) 36.0 - 46.0 %   MCV 96.1 80.0 - 100.0 fL   MCH 33.0 26.0 - 34.0 pg   MCHC 34.3 30.0 - 36.0 g/dL   RDW 13.1 11.5 - 15.5 %   Platelets 230 150 - 400 K/uL   nRBC 0.0 0.0 - 0.2 %  Rapid HIV screen (HIV 1/2 Ab+Ag)  Result Value Ref Range   HIV-1 P24 Antigen -  HIV24 NON REACTIVE NON REACTIVE   HIV 1/2 Antibodies NON REACTIVE NON REACTIVE   Interpretation (HIV Ag Ab)      A non reactive test result means that HIV 1 or HIV 2 antibodies and HIV 1 p24 antigen were not detected in the specimen.  RPR  Result Value Ref Range   RPR Ser Ql NON REACTIVE NON REACTIVE  Urinalysis, Routine w reflex microscopic  Result Value Ref Range   Color, Urine STRAW (A) YELLOW   APPearance CLEAR (A) CLEAR   Specific Gravity, Urine 1.010 1.005 - 1.030   pH 5.0 5.0 - 8.0   Glucose, UA NEGATIVE NEGATIVE mg/dL   Hgb urine dipstick NEGATIVE NEGATIVE   Bilirubin Urine NEGATIVE NEGATIVE   Ketones, ur 20 (A) NEGATIVE mg/dL   Protein, ur NEGATIVE NEGATIVE mg/dL   Nitrite NEGATIVE NEGATIVE   Leukocytes,Ua NEGATIVE NEGATIVE  Type and screen [facility]  Result Value Ref Range   ABO/RH(D) O POS    Antibody Screen NEG    Sample Expiration      01/27/2020,2359 Performed at  [facility], [address], [place], [place] [address]   ABO/Rh  Result Value Ref Range   ABO/RH(D)      O POS Performed at [facility], [address] [place], [place] [address]      Imaging:  US OB Comp + 14 Wk CLINICAL DATA:  Premature rupture of membranes. Evaluate fetus. Patient is 22 weeks 4 days by assigned EDC of 05/26/2020.  EXAM: OBSTETRIC 14+ WK ULTRASOUND FOLLOW-UP  FINDINGS: Number of Fetuses: 1  Heart Rate:  167 bpm  Movement: Present  Presentation: Cephalic  Previa: None  Placental Location: Posterior  Amniotic Fluid (Subjective): Normal  Amniotic Fluid (Objective):  Vertical pocket 7.3cm  FETAL BIOMETRY  BPD:  5.4cm 22w 3d  HC:    20.1cm 22w 2d  AC:    17.2cm 22w 1d  FL:    3.8cm 22w 1d  Current Mean GA: 21w 5d [place] EDC: 05/28/2020  Assigned GA: 22w 4d Assigned EDC: 05/26/2020  FETAL ANATOMY  Lateral Ventricles: Appears normal  Thalami/CSP: Appears normal  Posterior Fossa: Not visualized  Nuchal Region: Not applicable  Upper Lip: Not visualized  Spine: Not visualized  4 Chamber Heart on Left: Appears normal  LVOT: Not visualized  RVOT: Not visualized  Stomach on Left: Appears normal  3 Vessel Cord: Not visualized  Cord Insertion site: Not visualized  Kidneys: Appears normal  Bladder: Appears normal  Extremities: Not visualized  Sex: Not Visualized  Technical Limitations: None; limited anatomic survey secondary to patient's status.  Maternal Findings:  Cervix:  Cervical dilatation and funneling.  IMPRESSION: 1. Single living intrauterine fetus in cephalic presentation. 2. Size and dates correlate well. 3. Amniotic fluid volume is subjectively and objectively within normal limits. 4. Cervix is dilated and shows funneling.  These results were called by telephone at the time of interpretation on 01/25/2020 at 9:11 Am to provider [facility] , who verbally acknowledged these  results.  Electronically Signed   By: Norva Pavlov M.D.   On: 01/25/2020 09:11   Assessment:  29 y.o. female [redacted]w[redacted]d, with:    1. Preterm premature rupture of membranes (PPROM) with onset of labor after 24 hours of rupture in second trimester, antepartum       Plan:   1. Continue routine antenatal care. Can transfer back to antepartum floor as she has been stable overnight.  2. Strict bedrest, placed in Trendelenburg  positioning.  3. Continue Indocin dosing for 48-72 hrs. 4. Continue SCDs for DVT prophylaxis.  5. S/p Neonatology consult  6. Continue antibiotics for latency.  7. Plan for antenatal steroids once approaching viability.  8. Continue to monitor. Will begin to make plans for transfer with [facility].   Hildred Laser, MD [facility]

## 2020-01-27 DIAGNOSIS — O42112 Preterm premature rupture of membranes, onset of labor more than 24 hours following rupture, second trimester: Secondary | ICD-10-CM

## 2020-01-27 HISTORY — PX: DILATION AND CURETTAGE OF UTERUS: SHX78

## 2020-01-27 LAB — CULTURE, BETA STREP (GROUP B ONLY)

## 2020-01-28 MED ORDER — ACETAMINOPHEN 325 MG PO TABS
650.00 | ORAL_TABLET | ORAL | Status: DC
Start: ? — End: 2020-01-28

## 2020-01-28 MED ORDER — ALUM & MAG HYDROXIDE-SIMETH 400-400-40 MG/5ML PO SUSP
15.00 | ORAL | Status: DC
Start: ? — End: 2020-01-28

## 2020-01-28 MED ORDER — GENERIC EXTERNAL MEDICATION
10.00 | Status: DC
Start: ? — End: 2020-01-28

## 2020-01-28 MED ORDER — MISOPROSTOL 200 MCG PO TABS
600.00 | ORAL_TABLET | ORAL | Status: DC
Start: ? — End: 2020-01-28

## 2020-01-28 MED ORDER — ONDANSETRON HCL 4 MG/2ML IJ SOLN
4.00 | INTRAMUSCULAR | Status: DC
Start: ? — End: 2020-01-28

## 2020-01-28 MED ORDER — WITCH HAZEL-GLYCERIN EX PADS
1.00 | MEDICATED_PAD | CUTANEOUS | Status: DC
Start: ? — End: 2020-01-28

## 2020-01-28 MED ORDER — IBUPROFEN 600 MG PO TABS
600.00 | ORAL_TABLET | ORAL | Status: DC
Start: ? — End: 2020-01-28

## 2020-01-28 MED ORDER — SIMETHICONE 80 MG PO CHEW
80.00 | CHEWABLE_TABLET | ORAL | Status: DC
Start: ? — End: 2020-01-28

## 2020-01-28 MED ORDER — METHYLERGONOVINE MALEATE 0.2 MG/ML IJ SOLN
0.20 | INTRAMUSCULAR | Status: DC
Start: ? — End: 2020-01-28

## 2020-01-28 MED ORDER — DSS 100 MG PO CAPS
100.00 | ORAL_CAPSULE | ORAL | Status: DC
Start: 2020-01-29 — End: 2020-01-28

## 2020-01-28 MED ORDER — BENZOCAINE-BENZETHONIUM 20-0.2 % EX AERO
INHALATION_SPRAY | CUTANEOUS | Status: DC
Start: ? — End: 2020-01-28

## 2020-01-28 MED ORDER — PNV PRENATAL PLUS MULTIVITAMIN 27-1 MG PO TABS
1.00 | ORAL_TABLET | ORAL | Status: DC
Start: 2020-01-30 — End: 2020-01-28

## 2020-01-30 MED ORDER — SUMATRIPTAN SUCCINATE 50 MG PO TABS
50.00 | ORAL_TABLET | ORAL | Status: DC
Start: ? — End: 2020-01-30

## 2020-01-30 MED ORDER — ASCORBIC ACID 500 MG PO TABS
500.00 | ORAL_TABLET | ORAL | Status: DC
Start: 2020-01-31 — End: 2020-01-30

## 2020-01-30 MED ORDER — FERROUS SULFATE 324 MG PO TBEC
324.00 | DELAYED_RELEASE_TABLET | ORAL | Status: DC
Start: 2020-01-31 — End: 2020-01-30

## 2020-01-31 ENCOUNTER — Encounter: Payer: Medicaid Other | Admitting: Obstetrics and Gynecology

## 2020-02-27 ENCOUNTER — Other Ambulatory Visit: Payer: Self-pay

## 2020-02-27 ENCOUNTER — Ambulatory Visit (INDEPENDENT_AMBULATORY_CARE_PROVIDER_SITE_OTHER): Payer: Medicaid Other | Admitting: Obstetrics and Gynecology

## 2020-02-27 ENCOUNTER — Encounter: Payer: Self-pay | Admitting: Obstetrics and Gynecology

## 2020-02-27 DIAGNOSIS — O9081 Anemia of the puerperium: Secondary | ICD-10-CM | POA: Diagnosis not present

## 2020-02-27 DIAGNOSIS — O99345 Other mental disorders complicating the puerperium: Secondary | ICD-10-CM | POA: Diagnosis not present

## 2020-02-27 DIAGNOSIS — F4321 Adjustment disorder with depressed mood: Secondary | ICD-10-CM

## 2020-02-27 DIAGNOSIS — F53 Postpartum depression: Secondary | ICD-10-CM

## 2020-02-27 NOTE — Patient Instructions (Signed)
Managing Pregnancy Loss Pregnancy loss can happen any time during a pregnancy. Often the cause is not known. It is rarely because of anything you did. Pregnancy loss in early pregnancy (during the first trimester) is called a miscarriage. This type of pregnancy loss is the most common. Pregnancy loss that happens after 20 weeks of pregnancy is called fetal demise if the baby's heart stops beating before birth. Fetal demise is much less common. Some women experience spontaneous labor shortly after fetal demise resulting in a stillborn birth (stillbirth). Any pregnancy loss can be devastating. You will need to recover both physically and emotionally. Most women are able to get pregnant again after a pregnancy loss and deliver a healthy baby. How to manage emotional recovery  Pregnancy loss is very hard emotionally. You may feel many different emotions while you grieve. You may feel sad and angry. You may also feel guilty. It is normal to have periods of crying. Emotional recovery can take longer than physical recovery. It is different for everyone. Taking these steps can help you in managing this loss:  Remember that it is unlikely you did anything to cause the pregnancy loss.  Share your thoughts and feelings with friends, family, and your partner. Remember that your partner is also recovering emotionally.  Make sure you have a good support system. Do not spend too much time alone.  Meet with a pregnancy loss counselor or join a pregnancy loss support group.  Get enough sleep and eat a healthy diet. Return to regular exercise when you have recovered physically.  Do not use drugs or alcohol to manage your emotions.  Consider seeing a mental health professional to help you recover emotionally.  Ask a friend or loved one to help you decide what to do with any clothing and nursery items you received for your baby. In the case of a stillbirth, many women benefit from taking additional steps in the  grieving process. You may want to:  Hold your baby after the birth.  Name your baby.  Request a birth certificate.  Create a keepsake such as handprints or footprints.  Dress your baby and have a picture taken.  Make funeral arrangements.  Ask for a baptism or blessing. Hospitals have staff members who can help you with all these arrangements. How to recognize emotional stress It is normal to have emotional stress after a pregnancy loss. But emotional stress that lasts a long time or becomes severe requires treatment. Watch out for these signs of severe emotional stress:  Sadness, anger, or guilt that is not going away and is interfering with your normal activities.  Relationship problems that have occurred or gotten worse since the pregnancy loss.  Signs of depression that last longer than 2 weeks. These may include: ? Sadness. ? Anxiety. ? Hopelessness. ? Loss of interest in activities you enjoy. ? Inability to concentrate. ? Trouble sleeping or sleeping too much. ? Loss of appetite or overeating. ? Thoughts of death or of hurting yourself. Follow these instructions at home:  Take over-the-counter and prescription medicines only as told by your health care provider.  Rest at home until your energy level returns. Return to your normal activities as told by your health care provider. Ask your health care provider what activities are safe for you.  When you are ready, meet with your health care provider to discuss steps to take for a future pregnancy.  Keep all follow-up visits as told by your health care provider. This is important.   Where to find support  To help you and your partner with the process of grieving, talk with your health care provider or seek counseling.  Consider meeting with others who have experienced pregnancy loss. Ask your health care provider about support groups and resources. Where to find more information  [place].S[place] Department of Health and Human  Services Office on Women's Health: www.womenshealth.gov  American Pregnancy Association: www.americanpregnancy.org Contact a health care provider if:  You continue to experience grief, sadness, or lack of motivation for everyday activities, and those feelings do not improve over time.  You are struggling to recover emotionally, especially if you are using alcohol or substances to help. Get help right away if:  You have thoughts of hurting yourself or others. If you ever feel like you may hurt yourself or others, or have thoughts about taking your own life, get help right away. You can go to your nearest emergency department or call:  Your local emergency services (911 in the [place]  A suicide crisis helpline, such as the National Suicide Prevention Lifeline at 1-800-273-8255. This is open 24 hours a day. Summary  Any pregnancy loss can be difficult physically and emotionally.  You may experience many different emotions while you grieve. Emotional recovery can last longer than physical recovery.  It is normal to have emotional stress after a pregnancy loss. But emotional stress that lasts a long time or becomes severe requires treatment.  See your health care provider if you are struggling emotionally after a pregnancy loss. This information is not intended to replace advice given to you by your health care provider. Make sure you discuss any questions you have with your health care provider. Document Revised: 12/28/2018 Document Reviewed: 11/18/2017 Elsevier Patient Education  2020 Elsevier Inc.  

## 2020-02-27 NOTE — Progress Notes (Signed)
Pt present for postpartum visit. Pt stated that she was doing well under the circumstances her of baby passing away.  EPDS=15 PHQ-9=8.

## 2020-02-27 NOTE — Progress Notes (Signed)
   OBSTETRICS POSTPARTUM CLINIC PROGRESS NOTE  Subjective:     Helen Schneider is a 29 y.o. G60P0000 female who presents for a postpartum visit. She is 4 weeks postpartum following a spontaneous vaginal delivery at [redacted] weeks gestation, following PPPROM (transferred from [facility] to [facility]).  Infant survived for 2 weeks. I have fully reviewed the prenatal and intrapartum course.  Anesthesia: epidural. Postpartum course has been complicated by grief.  Bleeding: patient has not resumed menses. Is no longer lactating. Bowel function is normal. Bladder function is normal. Patient is not sexually active. Contraception method desired is undecided. Postpartum depression screening: positive. EPDS=15 PHQ-9=8.  The following portions of the patient's history were reviewed and updated as appropriate: allergies, current medications, past family history, past medical history, past social history, past surgical history and problem list.  Review of Systems Pertinent items noted in HPI and remainder of comprehensive ROS otherwise negative.   Objective:    BP 112/73   Pulse 67   Ht 5\' 6"  (1.676 m)   Wt 128 lb (58.1 kg)   LMP 08/20/2019 (Exact Date)   BMI 20.66 kg/m   General:  alert and no distress   Breasts:  inspection negative, no nipple discharge or bleeding, no masses or nodularity palpable  Lungs: clear to auscultation bilaterally  Heart:  regular rate and rhythm, S1, S2 normal, no murmur, click, rub or gallop  Abdomen: soft, non-tender; bowel sounds normal; no masses,  no organomegaly.     Vulva:  normal  Vagina: normal vagina, no discharge, exudate, lesion, or erythema  Cervix:  no cervical motion tenderness and no lesions  Corpus: normal size, contour, position, consistency, mobility, non-tender  Adnexa:  normal adnexa and no mass, fullness, tenderness  Rectal Exam: Not performed.         Labs:  Lab Results  Component Value Date   HGB 9.4 (L) 01/24/2020     Assessment:   1. Postpartum  examination following vaginal delivery   2. Preterm labor in second trimester with preterm delivery, single or unspecified fetus   3. Postpartum anemia   4. Postpartum depression   5. Grief associated with loss of fetus    Plan:    1. Contraception: undecided.  Patient is unsure if she desires to conceive again soon, or if she desires to wait for some time.  Discussed to wait at least 3 months after previous delivery, can use condoms or short term OCPs if desired.  2. Will check Hgb for h/o anemia.  3. Discussion had on postpartum depression and grief. Notes that she and her partner recently started seeing a grief counselor this week.  Also advised that grief and depression can look similar, however if not improving with time, can also consider medications for short time.  4. Discussion also had regarding management of future pregnancies. Will check for cervical incompetency early in the pregnancy, consider cerclage if indicated. Can also utilize progesterone supplementation during the pregnancy due to h/o preterm birth. Patient notes understanding.  Follow up in: 3 months for annual exam, or sooner as needed.    03/25/2020, MD [facility]

## 2020-04-16 ENCOUNTER — Other Ambulatory Visit: Payer: Self-pay

## 2020-04-16 MED ORDER — FLUCONAZOLE 150 MG PO TABS
150.0000 mg | ORAL_TABLET | Freq: Once | ORAL | 0 refills | Status: AC
Start: 2020-04-16 — End: 2020-04-16

## 2020-05-27 ENCOUNTER — Inpatient Hospital Stay: Admit: 2020-05-27 | Payer: Self-pay

## 2020-05-28 NOTE — Progress Notes (Signed)
Pt present for annual exam. Pt stated that she was doing well no problems.  

## 2020-05-28 NOTE — Patient Instructions (Addendum)
Preventive Care 21-29 Years Old, Female Preventive care refers to visits with your health care provider and lifestyle choices that can promote health and wellness. This includes:  A yearly physical exam. This may also be called an annual well check.  Regular dental visits and eye exams.  Immunizations.  Screening for certain conditions.  Healthy lifestyle choices, such as eating a healthy diet, getting regular exercise, not using drugs or products that contain nicotine and tobacco, and limiting alcohol use. What can I expect for my preventive care visit? Physical exam Your health care provider will check your:  Height and weight. This may be used to calculate body mass index (BMI), which tells if you are at a healthy weight.  Heart rate and blood pressure.  Skin for abnormal spots. Counseling Your health care provider may ask you questions about your:  Alcohol, tobacco, and drug use.  Emotional well-being.  Home and relationship well-being.  Sexual activity.  Eating habits.  Work and work environment.  Method of birth control.  Menstrual cycle.  Pregnancy history. What immunizations do I need?  Influenza (flu) vaccine  This is recommended every year. Tetanus, diphtheria, and pertussis (Tdap) vaccine  You may need a Td booster every 10 years. Varicella (chickenpox) vaccine  You may need this if you have not been vaccinated. Human papillomavirus (HPV) vaccine  If recommended by your health care provider, you may need three doses over 6 months. Measles, mumps, and rubella (MMR) vaccine  You may need at least one dose of MMR. You may also need a second dose. Meningococcal conjugate (MenACWY) vaccine  One dose is recommended if you are age 19-21 years and a first-year college student living in a residence hall, or if you have one of several medical conditions. You may also need additional booster doses. Pneumococcal conjugate (PCV13) vaccine  You may need  this if you have certain conditions and were not previously vaccinated. Pneumococcal polysaccharide (PPSV23) vaccine  You may need one or two doses if you smoke cigarettes or if you have certain conditions. Hepatitis A vaccine  You may need this if you have certain conditions or if you travel or work in places where you may be exposed to hepatitis A. Hepatitis B vaccine  You may need this if you have certain conditions or if you travel or work in places where you may be exposed to hepatitis B. Haemophilus influenzae type b (Hib) vaccine  You may need this if you have certain conditions. You may receive vaccines as individual doses or as more than one vaccine together in one shot (combination vaccines). Talk with your health care provider about the risks and benefits of combination vaccines. What tests do I need?  Blood tests  Lipid and cholesterol levels. These may be checked every 5 years starting at age 20.  Hepatitis C test.  Hepatitis B test. Screening  Diabetes screening. This is done by checking your blood sugar (glucose) after you have not eaten for a while (fasting).  Sexually transmitted disease (STD) testing.  BRCA-related cancer screening. This may be done if you have a family history of breast, ovarian, tubal, or peritoneal cancers.  Pelvic exam and Pap test. This may be done every 3 years starting at age 21. Starting at age 30, this may be done every 5 years if you have a Pap test in combination with an HPV test. Talk with your health care provider about your test results, treatment options, and if necessary, the need for more tests.   Follow these instructions at home: Eating and drinking   Eat a diet that includes fresh fruits and vegetables, whole grains, lean protein, and low-fat dairy.  Take vitamin and mineral supplements as recommended by your health care provider.  Do not drink alcohol if: ? Your health care provider tells you not to drink. ? You are  pregnant, may be pregnant, or are planning to become pregnant.  If you drink alcohol: ? Limit how much you have to 0-1 drink a day. ? Be aware of how much alcohol is in your drink. In the [place] one drink equals one 12 oz bottle of beer (355 mL), one 5 oz glass of wine (148 mL), or one 1 oz glass of hard liquor (44 mL). Lifestyle  Take daily care of your teeth and gums.  Stay active. Exercise for at least 30 minutes on 5 or more days each week.  Do not use any products that contain nicotine or tobacco, such as cigarettes, e-cigarettes, and chewing tobacco. If you need help quitting, ask your health care provider.  If you are sexually active, practice safe sex. Use a condom or other form of birth control (contraception) in order to prevent pregnancy and STIs (sexually transmitted infections). If you plan to become pregnant, see your health care provider for a preconception visit. What's next?  Visit your health care provider once a year for a well check visit.  Ask your health care provider how often you should have your eyes and teeth checked.  Stay up to date on all vaccines. This information is not intended to replace advice given to you by your health care provider. Make sure you discuss any questions you have with your health care provider. Document Revised: 05/19/2018 Document Reviewed: 05/19/2018 Elsevier Patient Education  2020 [employer] Breast Self-Awareness Breast self-awareness is knowing how your breasts look and feel. Doing breast self-awareness is important. It allows you to catch a breast problem early while it is still small and can be treated. All women should do breast self-awareness, including women who have had breast implants. Tell your doctor if you notice a change in your breasts. What you need:  A mirror.  A well-lit room. How to do a breast self-exam A breast self-exam is one way to learn what is normal for your breasts and to check for changes. To do a  breast self-exam: Look for changes  1. Take off all the clothes above your waist. 2. Stand in front of a mirror in a room with good lighting. 3. Put your hands on your hips. 4. Push your hands down. 5. Look at your breasts and nipples in the mirror to see if one breast or nipple looks different from the other. Check to see if: ? The shape of one breast is different. ? The size of one breast is different. ? There are wrinkles, dips, and bumps in one breast and not the other. 6. Look at each breast for changes in the skin, such as: ? Redness. ? Scaly areas. 7. Look for changes in your nipples, such as: ? Liquid around the nipples. ? Bleeding. ? Dimpling. ? Redness. ? A change in where the nipples are. Feel for changes  1. Lie on your back on the floor. 2. Feel each breast. To do this, follow these steps: ? Pick a breast to feel. ? Put the arm closest to that breast above your head. ? Use your other arm to feel the nipple area of your breast. Feel   breast. Feel the area with the pads of your three middle fingers by making small circles with your fingers. For the first circle, press lightly. For the second circle, press harder. For the third circle, press even harder. ? Keep making circles with your fingers at the different pressures as you move down your breast. Stop when you feel your ribs. ? Move your fingers a little toward the center of your body. ? Start making circles with your fingers again, this time going up until you reach your collarbone. ? Keep making up-and-down circles until you reach your armpit. Remember to keep using the three pressures. ? Feel the other breast in the same way. 3. Sit or stand in the tub or shower. 4. With soapy water on your skin, feel each breast the same way you did in step 2 when you were lying on the floor. Write down what you find Writing down what you find can help you remember what to tell your doctor. Write down:  What is normal for each breast.  Any  changes you find in each breast, including: ? The kind of changes you find. ? Whether you have pain. ? Size and location of any lumps.  When you last had your menstrual period. General tips  Check your breasts every month.  If you are breastfeeding, the best time to check your breasts is after you feed your baby or after you use a breast pump.  If you get menstrual periods, the best time to check your breasts is 5-7 days after your menstrual period is over.  With time, you will become comfortable with the self-exam, and you will begin to know if there are changes in your breasts. Contact a doctor if you:  See a change in the shape or size of your breasts or nipples.  See a change in the skin of your breast or nipples, such as red or scaly skin.  Have fluid coming from your nipples that is not normal.  Find a lump or thick area that was not there before.  Have pain in your breasts.  Have any concerns about your breast health. Summary  Breast self-awareness includes looking for changes in your breasts, as well as feeling for changes within your breasts.  Breast self-awareness should be done in front of a mirror in a well-lit room.  You should check your breasts every month. If you get menstrual periods, the best time to check your breasts is 5-7 days after your menstrual period is over.  Let your doctor know of any changes you see in your breasts, including changes in size, changes on the skin, pain or tenderness, or fluid from your nipples that is not normal. This information is not intended to replace advice given to you by your health care provider. Make sure you discuss any questions you have with your health care provider. Document Revised: 04/26/2018 Document Reviewed: 04/26/2018 Elsevier Patient Education  2020 [employer].    Preparing for Pregnancy If you are considering becoming pregnant, make an appointment to see your regular health care provider to learn how  to prepare for a safe and healthy pregnancy (preconception care). During a preconception care visit, your health care provider will:  Do a complete physical exam, including a Pap test.  Take a complete medical history.  Give you information, answer your questions, and help you resolve problems. Preconception checklist Medical history  Tell your health care provider about any current or past medical conditions. Your pregnancy or  become pregnant may be affected by chronic conditions, such as diabetes, chronic hypertension, and thyroid problems.  Include your family's medical history as well as your partner's medical history.  Tell your health care provider about any history of STIs (sexually transmitted infections).These can affect your pregnancy. In some cases, they can be passed to your baby. Discuss any concerns that you have about STIs.  If indicated, discuss the benefits of genetic testing. This testing will show whether there are any genetic conditions that may be passed from you or your partner to your baby.  Tell your health care provider about: ? Any problems you have had with conception or pregnancy. ? Any medicines you take. These include vitamins, herbal supplements, and over-the-counter medicines. ? Your history of immunizations. Discuss any vaccinations that you may need. Diet  Ask your health care provider what to include in a healthy diet that has a balance of nutrients. This is especially important when you are pregnant or preparing to become pregnant.  Ask your health care provider to help you reach a healthy weight before pregnancy. ? If you are overweight, you may be at higher risk for certain complications, such as high blood pressure, diabetes, and preterm birth. ? If you are underweight, you are more likely to have a baby who has a low birth weight. Lifestyle, work, and home  Let your health care provider know: ? About any lifestyle habits that you  have, such as alcohol use, drug use, or smoking. ? About recreational activities that may put you at risk during pregnancy, such as downhill skiing and certain exercise programs. ? Tell your health care provider about any international travel, especially any travel to places with an active Zika virus outbreak. ? About harmful substances that you may be exposed to at work or at home. These include chemicals, pesticides, radiation, or even litter boxes. ? If you do not feel safe at home. Mental health  Tell your health care provider about: ? Any history of mental health conditions, including feelings of depression, sadness, or anxiety. ? Any medicines that you take for a mental health condition. These include herbs and supplements. Home instructions to prepare for pregnancy Lifestyle   Eat a balanced diet. This includes fresh fruits and vegetables, whole grains, lean meats, low-fat dairy products, healthy fats, and foods that are high in fiber. Ask to meet with a nutritionist or registered dietitian for assistance with meal planning and goals.  Get regular exercise. Try to be active for at least 30 minutes a day on most days of the week. Ask your health care provider which activities are safe during pregnancy.  Do not use any products that contain nicotine or tobacco, such as cigarettes and e-cigarettes. If you need help quitting, ask your health care provider.  Do not drink alcohol.  Do not take illegal drugs.  Maintain a healthy weight. Ask your health care provider what weight range is right for you. General instructions  Keep an accurate record of your menstrual periods. This makes it easier for your health care provider to determine your baby's due date.  Begin taking prenatal vitamins and folic acid supplements daily as directed by your health care provider.  Manage any chronic conditions, such as high blood pressure and diabetes, as told by your health care provider. This is  important. How do I know that I am pregnant? You may be pregnant if you have been sexually active and you miss your period. Symptoms of early pregnancy include:    early pregnancy include:  Mild cramping.  Very light vaginal bleeding (spotting).  Feeling unusually tired.  Nausea and vomiting (morning sickness). If you have any of these symptoms and you suspect that you might be pregnant, you can take a home pregnancy test. These tests check for a hormone in your urine (human chorionic gonadotropin, or hCG). A woman's body begins to make this hormone during early pregnancy. These tests are very accurate. Wait until at least the first day after you miss your period to take one. If the test shows that you are pregnant (you get a positive result), call your health care provider to make an appointment for prenatal care. What should I do if I become pregnant?      Make an appointment with your health care provider as soon as you suspect you are pregnant.  Do not use any products that contain nicotine, such as cigarettes, chewing tobacco, and e-cigarettes. If you need help quitting, ask your health care provider.  Do not drink alcoholic beverages. Alcohol is related to a number of birth defects.  Avoid toxic odors and chemicals.  You may continue to have sexual intercourse if it does not cause pain or other problems, such as vaginal bleeding. This information is not intended to replace advice given to you by your health care provider. Make sure you discuss any questions you have with your health care provider. Document Revised: 09/09/2017 Document Reviewed: 03/29/2016 Elsevier Patient Education  [place].

## 2020-05-29 ENCOUNTER — Other Ambulatory Visit: Payer: Self-pay

## 2020-05-29 ENCOUNTER — Encounter: Payer: Self-pay | Admitting: Obstetrics and Gynecology

## 2020-05-29 ENCOUNTER — Ambulatory Visit (INDEPENDENT_AMBULATORY_CARE_PROVIDER_SITE_OTHER): Payer: Medicaid Other | Admitting: Obstetrics and Gynecology

## 2020-05-29 VITALS — BP 106/71 | HR 67 | Ht 66.0 in | Wt 125.5 lb

## 2020-05-29 DIAGNOSIS — Z3009 Encounter for other general counseling and advice on contraception: Secondary | ICD-10-CM

## 2020-05-29 DIAGNOSIS — Z0000 Encounter for general adult medical examination without abnormal findings: Secondary | ICD-10-CM

## 2020-05-29 DIAGNOSIS — Z8751 Personal history of pre-term labor: Secondary | ICD-10-CM | POA: Diagnosis not present

## 2020-05-29 DIAGNOSIS — Z3169 Encounter for other general counseling and advice on procreation: Secondary | ICD-10-CM | POA: Diagnosis not present

## 2020-05-29 DIAGNOSIS — Z01419 Encounter for gynecological examination (general) (routine) without abnormal findings: Secondary | ICD-10-CM
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
## 2020-05-30 LAB — COMPREHENSIVE METABOLIC PANEL
ALT: 9 IU/L (ref 0–32)
AST: 17 IU/L (ref 0–40)
Albumin/Globulin Ratio: 1.6 (ref 1.2–2.2)
Albumin: 4.6 g/dL (ref 3.9–5.0)
Alkaline Phosphatase: 53 IU/L (ref 48–121)
BUN/Creatinine Ratio: 10 (ref 9–23)
BUN: 10 mg/dL (ref 6–20)
Bilirubin Total: 0.9 mg/dL (ref 0.0–1.2)
CO2: 25 mmol/L (ref 20–29)
Calcium: 9.7 mg/dL (ref 8.7–10.2)
Chloride: 104 mmol/L (ref 96–106)
Creatinine, Ser: 1.04 mg/dL — ABNORMAL HIGH (ref 0.57–1.00)
GFR calc Af Amer: 84 mL/min/{1.73_m2} (ref >59)
GFR calc non Af Amer: 73 mL/min/{1.73_m2} (ref >59)
Globulin, Total: 2.8 g/dL (ref 1.5–4.5)
Glucose: 69 mg/dL (ref 65–99)
Potassium: 4.7 mmol/L (ref 3.5–5.2)
Sodium: 140 mmol/L (ref 134–144)
Total Protein: 7.4 g/dL (ref 6.0–8.5)

## 2020-05-30 LAB — CBC
Hematocrit: 32.9 % — ABNORMAL LOW (ref 34.0–46.6)
Hemoglobin: 10.5 g/dL — ABNORMAL LOW (ref 11.1–15.9)
MCH: 27.4 pg (ref 26.6–33.0)
MCHC: 31.9 g/dL (ref 31.5–35.7)
MCV: 86 fL (ref 79–97)
Platelets: 294 10*3/uL (ref 150–450)
RBC: 3.83 x10E6/uL (ref 3.77–5.28)
RDW: 15.6 % — ABNORMAL HIGH (ref 11.7–15.4)
WBC: 4 10*3/uL (ref 3.4–10.8)

## 2020-07-01 IMAGING — US US OB COMP +14 WK
1 series · 13 of 28 positions shown · non-contrast
Comparison: none

CLINICAL DATA: Premature rupture of membranes. Evaluate fetus.
Patient is 22 weeks 4 days by assigned EDC of 05/26/2020.

EXAM:
OBSTETRIC 14+ WK ULTRASOUND FOLLOW-UP

[Series 1: us ob follow up · 13 of 59 slices shown]
[im 3/59]
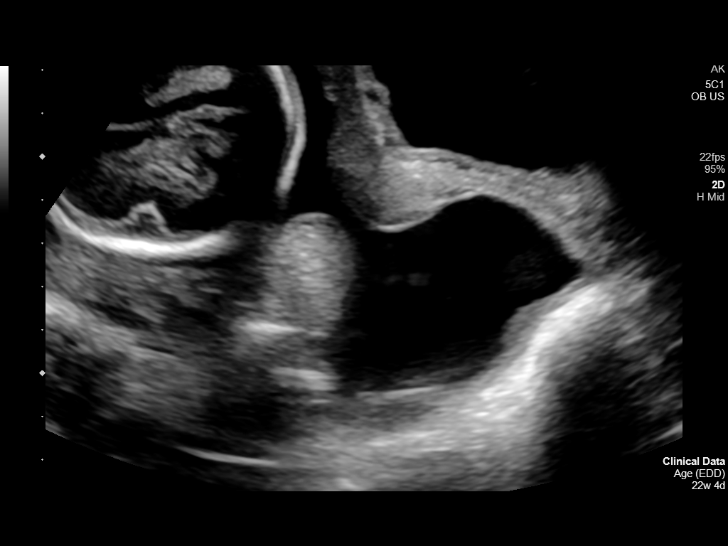
[im 7/59]
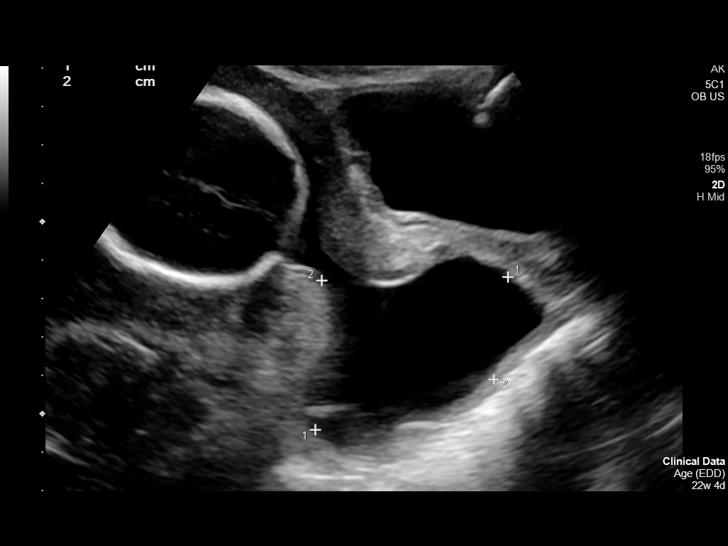
[im 11/59]
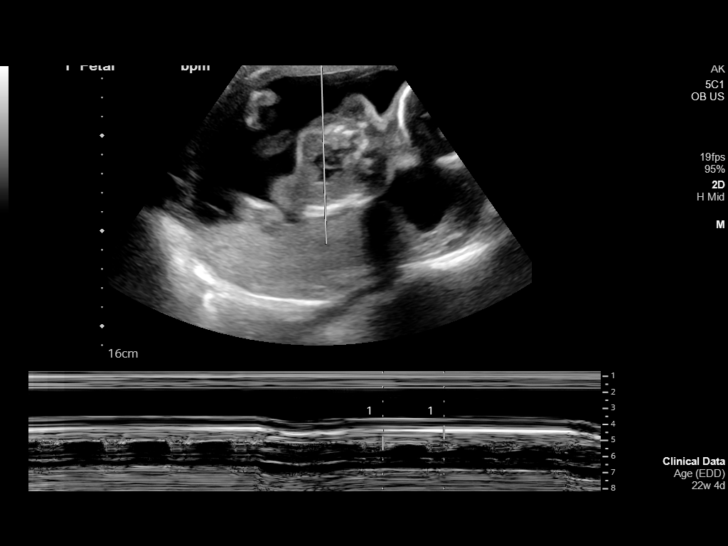
[im 16/59]
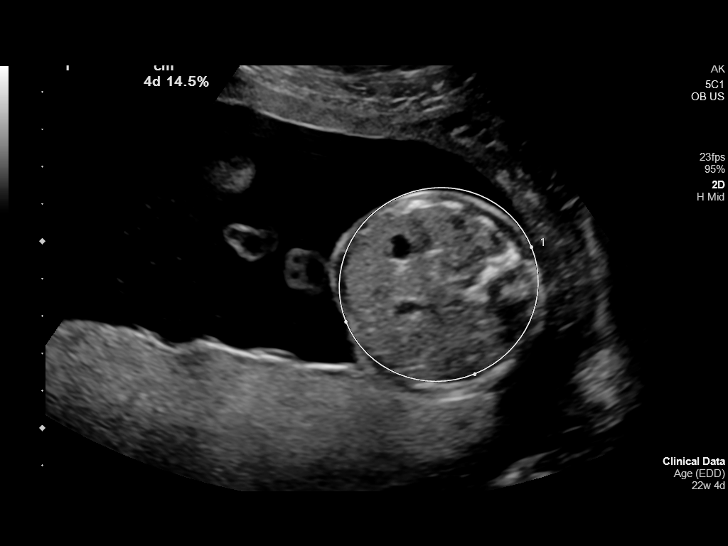
[im 20/59]
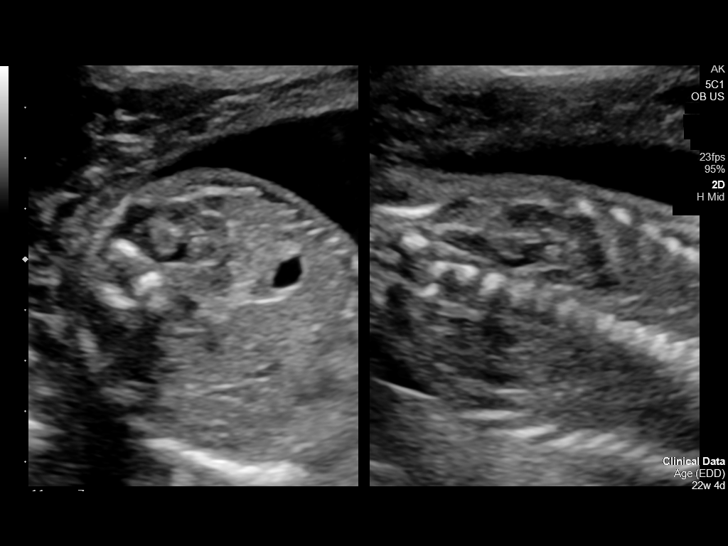
[im 24/59]
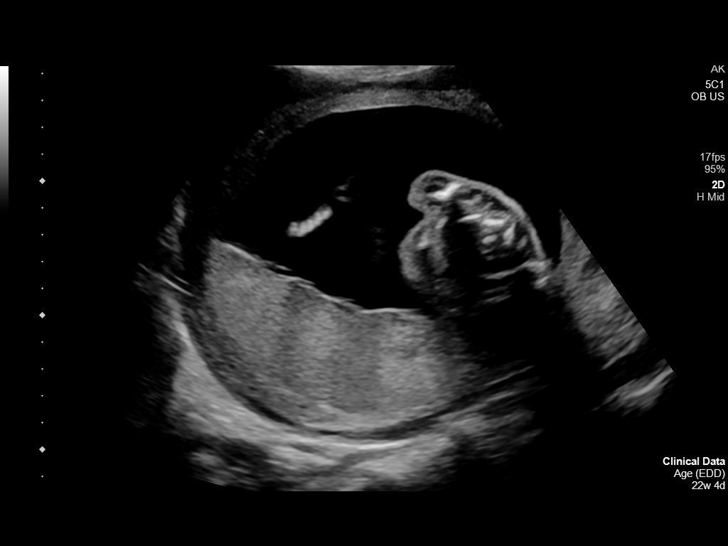
[im 31/59]
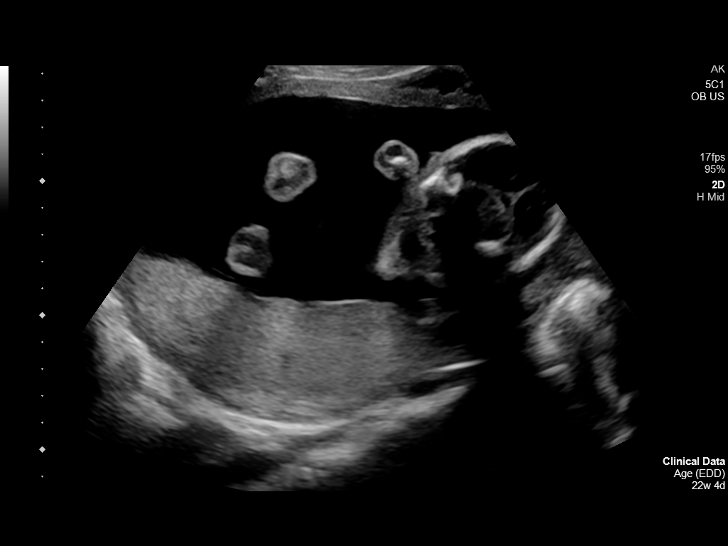
[im 35/59]
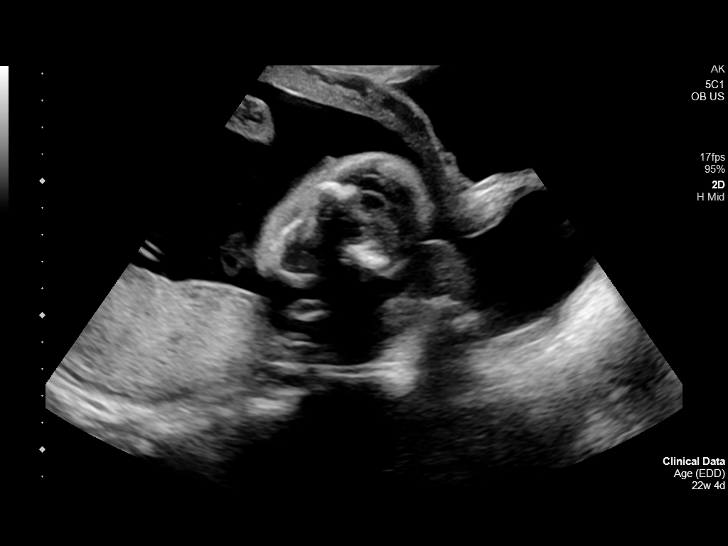
[im 39/59]
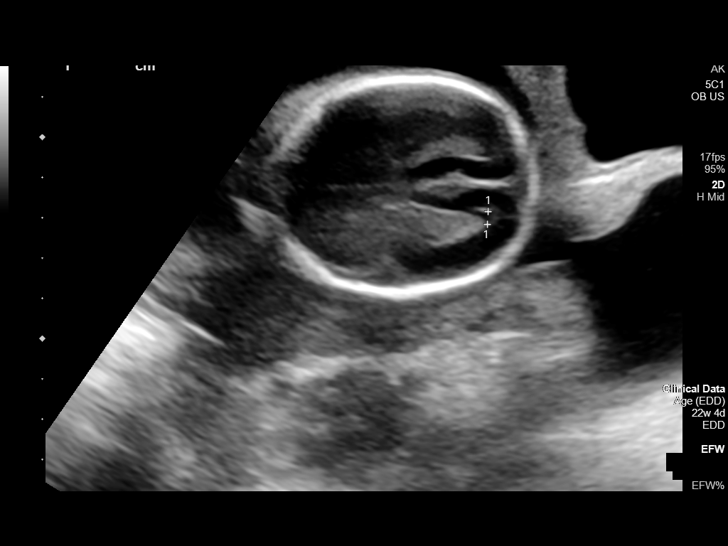
[im 43/59]
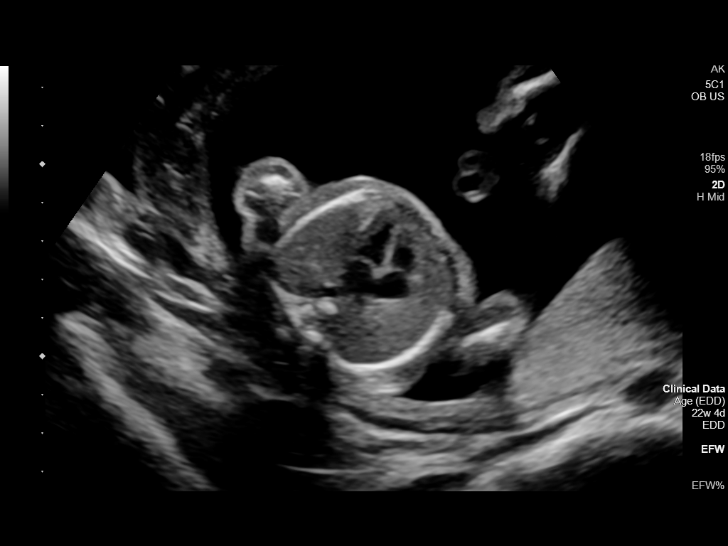
[im 48/59]
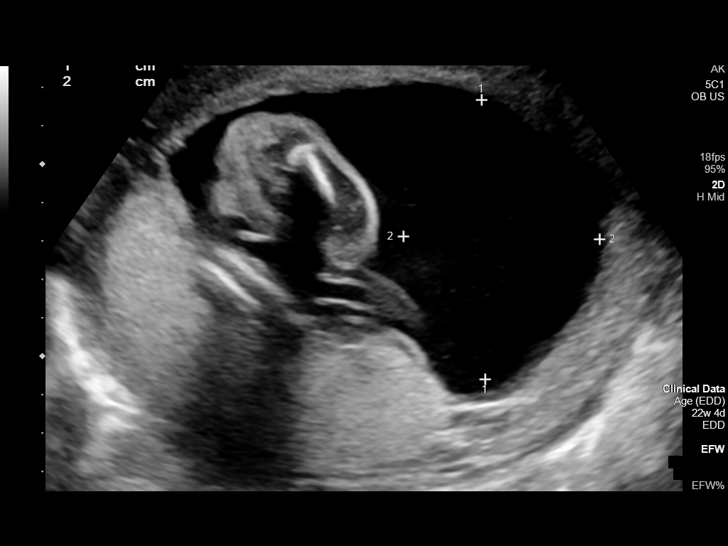
[im 52/59]
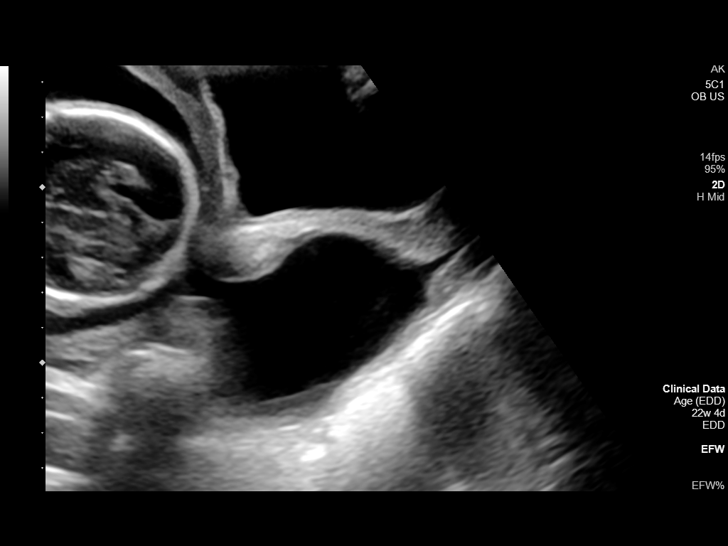
[im 56/59]
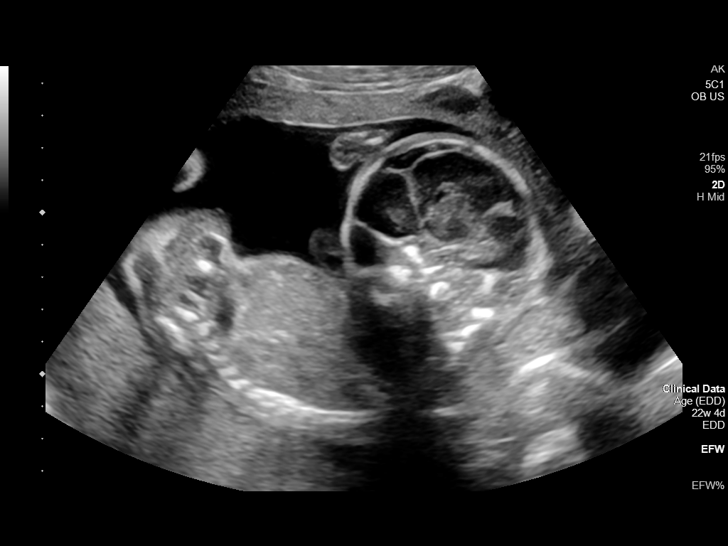

[13 of 28 positions shown; findings below may reference images not displayed]

FINDINGS: Number of Fetuses: 1

Heart Rate:  167 bpm

Movement: Present

Presentation: Cephalic

Previa: None

Placental Location: Posterior

Amniotic Fluid (Subjective): Normal

Amniotic Fluid (Objective):

Vertical pocket 7.3cm

FETAL BIOMETRY

BPD:  5.4cm 22w 3d

HC:    20.1cm 22w 2d

AC:    17.2cm 22w 1d

FL:    3.8cm 22w 1d

Current Mean GA: 21w 5d US EDC: 05/28/2020

Assigned GA: 22w 4d Assigned EDC: 05/26/2020

FETAL ANATOMY

Lateral Ventricles: Appears normal

Thalami/CSP: Appears normal

Posterior Fossa: Not visualized

Nuchal Region: Not applicable

Upper Lip: Not visualized

Spine: Not visualized

4 Chamber Heart on Left: Appears normal

LVOT: Not visualized

RVOT: Not visualized

Stomach on Left: Appears normal

3 Vessel Cord: Not visualized

Cord Insertion site: Not visualized

Kidneys: Appears normal

Bladder: Appears normal

Extremities: Not visualized

Sex: Not Visualized

Technical Limitations: None; limited anatomic survey secondary to
patient's status.

Maternal Findings:

Cervix:  Cervical dilatation and funneling.
IMPRESSION: 1. Single living intrauterine fetus in cephalic presentation.
2. Size and dates correlate well.
3. Amniotic fluid volume is subjectively and objectively within
normal limits.
4. Cervix is dilated and shows funneling.

These results were called by telephone at the time of interpretation
on 01/25/2020 at [DATE] to provider KAKI JIM , who verbally
acknowledged these results.

## 2020-07-11 ENCOUNTER — Other Ambulatory Visit: Payer: Self-pay | Admitting: Internal Medicine

## 2020-07-25 ENCOUNTER — Ambulatory Visit (INDEPENDENT_AMBULATORY_CARE_PROVIDER_SITE_OTHER): Payer: Medicaid Other | Admitting: Internal Medicine

## 2020-07-25 ENCOUNTER — Other Ambulatory Visit: Payer: Self-pay

## 2020-07-25 ENCOUNTER — Encounter: Payer: Self-pay | Admitting: Internal Medicine

## 2020-07-25 VITALS — BP 112/78 | HR 84 | Temp 98.1°F | Ht 66.0 in | Wt 125.0 lb

## 2020-07-25 DIAGNOSIS — N760 Acute vaginitis: Secondary | ICD-10-CM | POA: Diagnosis not present

## 2020-07-25 DIAGNOSIS — B9689 Other specified bacterial agents as the cause of diseases classified elsewhere: Secondary | ICD-10-CM

## 2020-07-25 DIAGNOSIS — N949 Unspecified condition associated with female genital organs and menstrual cycle: Secondary | ICD-10-CM | POA: Diagnosis not present

## 2020-07-25 DIAGNOSIS — D573 Sickle-cell trait: Secondary | ICD-10-CM

## 2020-07-25 DIAGNOSIS — N9489 Other specified conditions associated with female genital organs and menstrual cycle: Secondary | ICD-10-CM

## 2020-07-25 LAB — POCT URINALYSIS DIPSTICK
Bilirubin, UA: NEGATIVE
Blood, UA: NEGATIVE
Glucose, UA: NEGATIVE
Ketones, UA: NEGATIVE
Leukocytes, UA: NEGATIVE
Nitrite, UA: NEGATIVE
Protein, UA: NEGATIVE
Spec Grav, UA: 1.025 (ref 1.010–1.025)
Urobilinogen, UA: 0.2 E.U./dL
pH, UA: 5 (ref 5.0–8.0)

## 2020-07-25 LAB — POCT WET PREP WITH KOH
KOH Prep POC: NEGATIVE
RBC Wet Prep HPF POC: 5
Trichomonas, UA: NEGATIVE
WBC Wet Prep HPF POC: 5

## 2020-07-25 MED ORDER — METRONIDAZOLE 0.75 % VA GEL: 1.0000 | Freq: Two times a day (BID) | VAGINAL | 1 refills | Status: AC

## 2020-07-25 NOTE — Progress Notes (Signed)
Date:  07/25/2020   Name:  Helen Schneider   DOB:  07-17-1991   MRN:  [id number]   Chief Complaint: vaginal burning (Sxs started last week with itching. Patient started using Vagasil with no releif. Patient complaining of vaginal itching and burning. Pain intensifies during intercourse with her partner. )  Vaginal Itching The patient's primary symptoms include genital itching and a genital odor. The patient's pertinent negatives include no vaginal bleeding or vaginal discharge. This is a new problem. The current episode started in the past 7 days. The problem occurs constantly. The problem has been unchanged. The patient is experiencing no pain. She is not pregnant. Pertinent negatives include no abdominal pain or flank pain. The symptoms are aggravated by intercourse. She has tried nothing for the symptoms. She is sexually active.    Lab Results  Component Value Date   CREATININE 1.04 (H) 05/29/2020   BUN 10 05/29/2020   NA 140 05/29/2020   K 4.7 05/29/2020   CL 104 05/29/2020   CO2 25 05/29/2020   No results found for: CHOL, HDL, LDLCALC, LDLDIRECT, TRIG, CHOLHDL No results found for: TSH No results found for: HGBA1C Lab Results  Component Value Date   WBC 4.0 05/29/2020   HGB 10.5 (L) 05/29/2020   HCT 32.9 (L) 05/29/2020   MCV 86 05/29/2020   PLT 294 05/29/2020   Lab Results  Component Value Date   ALT 9 05/29/2020   AST 17 05/29/2020   ALKPHOS 53 05/29/2020   BILITOT 0.9 05/29/2020     Review of Systems  Constitutional: Negative for fatigue.  Respiratory: Negative for chest tightness and shortness of breath.   Cardiovascular: Negative for chest pain.  Gastrointestinal: Negative for abdominal pain.  Genitourinary: Positive for dyspareunia and genital sores (vaginal itching). Negative for flank pain and vaginal discharge.    Patient Active Problem List   Diagnosis Date Noted  . Preterm premature rupture of membranes (PPROM) with onset of labor within 24 hours of  rupture in second trimester, antepartum 01/24/2020  . Sickle cell trait (HCC) 11/08/2019  . Anxiety and depression 05/16/2018    Allergies  Allergen Reactions  . Kiwi Extract   . Cherry Hives  . Mushroom Extract Complex Hives    Past Surgical History:  Procedure Laterality Date  . DILATION AND CURETTAGE OF UTERUS  01/27/2020   for removal of placenta  . WISDOM TOOTH EXTRACTION  2013    Social History   Tobacco Use  . Smoking status: Never Smoker  . Smokeless tobacco: Never Used  Vaping Use  . Vaping Use: Never used  Substance Use Topics  . Alcohol use: Never  . Drug use: Never     Medication list has been reviewed and updated.  Current Meds  Medication Sig  . Ascorbic Acid (VITAMIN C PO) Take by mouth.  . Ferrous Sulfate (IRON) 325 (65 Fe) MG TABS Take by mouth.  . Prenatal Vit-Fe Fumarate-FA (PRENATAL VITAMIN PO) Take by mouth.  . Probiotic Product (GNP 4X PROBIOTIC) TABS Take by mouth.    PHQ 2/9 Scores 07/25/2020 02/27/2020 07/06/2019 05/17/2019  PHQ - 2 Score 0 2 0 1  PHQ- 9 Score 0 8 0 1    GAD 7 : Generalized Anxiety Score 07/25/2020  Nervous, Anxious, on Edge 0  Control/stop worrying 0  Worry too much - different things 0  Trouble relaxing 0  Restless 0  Easily annoyed or irritable 0  Afraid - awful might happen 0  Total GAD  7 Score 0  Anxiety Difficulty Not difficult at all    BP Readings from Last 3 Encounters:  07/25/20 112/78  05/29/20 106/71  02/27/20 112/73    Physical Exam Vitals and nursing note reviewed.  Constitutional:      General: She is not in acute distress.    Appearance: She is well-developed.  HENT:     Head: Normocephalic and atraumatic.  Cardiovascular:     Rate and Rhythm: Normal rate and regular rhythm.  Pulmonary:     Effort: Pulmonary effort is normal. No respiratory distress.     Breath sounds: No wheezing or rhonchi.  Abdominal:     Palpations: Abdomen is soft.     Tenderness: There is no abdominal tenderness.    Musculoskeletal:        General: Normal range of motion.  Skin:    General: Skin is warm and dry.     Findings: No rash.  Neurological:     Mental Status: She is alert and oriented to person, place, and time.  Psychiatric:        Behavior: Behavior normal.        Thought Content: Thought content normal.     Wt Readings from Last 3 Encounters:  07/25/20 125 lb (56.7 kg)  05/29/20 125 lb 8 oz (56.9 kg)  02/27/20 128 lb (58.1 kg)    BP 112/78   Pulse 84   Temp 98.1 F (36.7 C) (Oral)   Ht 5\' 6"  (1.676 m)   Wt 125 lb (56.7 kg)   SpO2 98%   BMI 20.18 kg/m   Assessment and Plan: 1. BV (bacterial vaginosis) - metroNIDAZOLE (METROGEL) 0.75 % vaginal gel; Place 1 Applicatorful vaginally 2 (two) times daily for 7 days.  Dispense: 70 g; Refill: 1 - POCT Wet Prep with KOH  2. Vaginal burning UA negative - POCT Urinalysis Dipstick  3. Sickle cell trait (HCC)   Partially dictated using . Any errors are unintentional.  Animal nutritionist, MD [facility]  07/25/2020

## 2020-08-29 ENCOUNTER — Ambulatory Visit
Admission: EM | Admit: 2020-08-29 | Discharge: 2020-08-29 | Disposition: A | Discharge status: Home / Self Care | Payer: Medicaid Other | Attending: Urgent Care | Admitting: Urgent Care

## 2020-08-29 ENCOUNTER — Ambulatory Visit (INDEPENDENT_AMBULATORY_CARE_PROVIDER_SITE_OTHER): Payer: Medicaid Other

## 2020-08-29 DIAGNOSIS — M25471 Effusion, right ankle: Secondary | ICD-10-CM | POA: Diagnosis not present

## 2020-08-29 DIAGNOSIS — S93401A Sprain of unspecified ligament of right ankle, initial encounter: Secondary | ICD-10-CM | POA: Diagnosis not present

## 2020-08-29 DIAGNOSIS — M25571 Pain in right ankle and joints of right foot: Secondary | ICD-10-CM

## 2020-08-29 MED ORDER — NAPROXEN 375 MG PO TABS: 375.0000 mg | ORAL_TABLET | Freq: Two times a day (BID) | ORAL | 0 refills | Status: DC

## 2020-08-29 NOTE — ED Provider Notes (Signed)
Helen Schneider   MRN: [id number] DOB: 1991/08/07  Subjective:   Helen Schneider is a 29 y.o. female presenting for suffering a right ankle injury from stepping wrong off a curb last night.  Patient states that she rolled her ankle laterally and felt a pop.  She has since had difficulty bearing weight but can walk just with a limp.  No current facility-administered medications for this encounter.  Current Outpatient Medications:  .  Ascorbic Acid (VITAMIN C PO), Take by mouth., Disp: , Rfl:  .  Ferrous Sulfate (IRON) 325 (65 Fe) MG TABS, Take by mouth., Disp: , Rfl:  .  Prenatal Vit-Fe Fumarate-FA (PRENATAL VITAMIN PO), Take by mouth., Disp: , Rfl:  .  Probiotic Product (GNP 4X PROBIOTIC) TABS, Take by mouth., Disp: , Rfl:    Allergies  Allergen Reactions  . Kiwi Extract   . Cherry Hives  . Mushroom Extract Complex Hives    Past Medical History:  Diagnosis Date  . Constipation 2016  . IBS (irritable bowel syndrome) 2016  . Iron deficiency anemia      Past Surgical History:  Procedure Laterality Date  . DILATION AND CURETTAGE OF UTERUS  01/27/2020   for removal of placenta  . WISDOM TOOTH EXTRACTION  2013    Family History  Problem Relation Age of Onset  . Multiple sclerosis Father   . Lung cancer Maternal Grandfather   . Breast cancer Neg Hx   . Ovarian cancer Neg Hx   . Colon cancer Neg Hx     Social History   Tobacco Use  . Smoking status: Never Smoker  . Smokeless tobacco: Never Used  Vaping Use  . Vaping Use: Never used  Substance Use Topics  . Alcohol use: Never  . Drug use: Never    ROS   Objective:   Vitals: BP 109/78 (BP Location: Left Arm)   Pulse 81   Temp 98.3 F (36.8 C) (Oral)   Resp 16   LMP 08/29/2020   SpO2 98%   Physical Exam Constitutional:      General: She is not in acute distress.    Appearance: Normal appearance. She is well-developed. She is not ill-appearing.  HENT:     Head: Normocephalic and atraumatic.      Nose: Nose normal.     Mouth/Throat:     Mouth: Mucous membranes are moist.     Pharynx: Oropharynx is clear.  Eyes:     General: No scleral icterus.    Extraocular Movements: Extraocular movements intact.     Pupils: Pupils are equal, round, and reactive to light.  Cardiovascular:     Rate and Rhythm: Normal rate.  Pulmonary:     Effort: Pulmonary effort is normal.  Musculoskeletal:     Right ankle: Swelling (Trace) present. No deformity, ecchymosis or lacerations. Tenderness present over the AITF ligament. No lateral malleolus, medial malleolus, ATF ligament, CF ligament, posterior TF ligament, base of 5th metatarsal or proximal fibula tenderness. Decreased range of motion.     Right Achilles Tendon: No tenderness or defects. Thompson's test negative.     Right foot: Normal range of motion. No tenderness.  Skin:    General: Skin is warm and dry.  Neurological:     General: No focal deficit present.     Mental Status: She is alert and oriented to person, place, and time.  Psychiatric:        Mood and Affect: Mood normal.  Behavior: Behavior normal.        Thought Content: Thought content normal.        Judgment: Judgment normal.    DG Ankle Complete Right  Result Date: 08/29/2020 CLINICAL DATA:  Twisted ankle EXAM: RIGHT ANKLE - COMPLETE 3+ VIEW COMPARISON:  None. FINDINGS: There is no evidence of fracture, dislocation, or joint effusion. There is no evidence of arthropathy or other focal bone abnormality. Soft tissues are unremarkable. IMPRESSION: No fracture or dislocation of the right ankle. Joint spaces are preserved. Electronically Signed   By: Lauralyn Primes M.D.   On: 08/29/2020 14:49    Right ankle wrapped using 4" Ace wrap in figure-8 method.   Assessment and Plan :   PDMP not reviewed this encounter.  1. Pain and swelling of right ankle   2. Sprain of right ankle, unspecified ligament, initial encounter     Will manage for ankle sprain with rice method,  NSAID. Counseled patient on potential for adverse effects with medications prescribed/recommended today, ER and return-to-clinic precautions discussed, patient verbalized understanding.    Wallis Bamberg, PA-C 08/29/20 1453

## 2020-08-29 NOTE — ED Triage Notes (Signed)
Stepped off curb wrong last night and rolled R ankle, felt a pop.  Some bruising and swelling on outer ankle.  Increased pain with weight bearing.

## 2020-11-14 LAB — HM PAP SMEAR: HM Pap smear: NORMAL

## 2020-11-14 LAB — RESULTS CONSOLE HPV: CHL HPV: NEGATIVE

## 2021-06-04 ENCOUNTER — Other Ambulatory Visit: Payer: Self-pay

## 2021-06-04 ENCOUNTER — Encounter: Payer: Self-pay | Admitting: *Deleted

## 2021-06-04 ENCOUNTER — Emergency Department: Payer: Medicaid Other

## 2021-06-04 ENCOUNTER — Inpatient Hospital Stay
Admission: EM | Admit: 2021-06-04 | Discharge: 2021-06-06 | DRG: 769 | Disposition: A | Discharge status: Home / Self Care | Payer: Medicaid Other | Attending: Obstetrics and Gynecology | Admitting: Obstetrics and Gynecology

## 2021-06-04 DIAGNOSIS — O9903 Anemia complicating the puerperium: Secondary | ICD-10-CM | POA: Diagnosis present

## 2021-06-04 DIAGNOSIS — Z20822 Contact with and (suspected) exposure to covid-19: Secondary | ICD-10-CM | POA: Diagnosis present

## 2021-06-04 DIAGNOSIS — N939 Abnormal uterine and vaginal bleeding, unspecified: Secondary | ICD-10-CM

## 2021-06-04 DIAGNOSIS — O8612 Endometritis following delivery: Secondary | ICD-10-CM | POA: Diagnosis present

## 2021-06-04 DIAGNOSIS — D573 Sickle-cell trait: Secondary | ICD-10-CM | POA: Diagnosis present

## 2021-06-04 LAB — CBC WITH DIFFERENTIAL/PLATELET
Abs Immature Granulocytes: 0.12 10*3/uL — ABNORMAL HIGH (ref 0.00–0.07)
Basophils Absolute: 0 10*3/uL (ref 0.0–0.1)
Basophils Relative: 0 %
Eosinophils Absolute: 0.1 10*3/uL (ref 0.0–0.5)
Eosinophils Relative: 0 %
HCT: 24.1 % — ABNORMAL LOW (ref 36.0–46.0)
Hemoglobin: 7.9 g/dL — ABNORMAL LOW (ref 12.0–15.0)
Immature Granulocytes: 1 %
Lymphocytes Relative: 7 %
Lymphs Abs: 1.4 10*3/uL (ref 0.7–4.0)
MCH: 31.2 pg (ref 26.0–34.0)
MCHC: 32.8 g/dL (ref 30.0–36.0)
MCV: 95.3 fL (ref 80.0–100.0)
Monocytes Absolute: 0.9 10*3/uL (ref 0.1–1.0)
Monocytes Relative: 5 %
Neutro Abs: 17.2 10*3/uL — ABNORMAL HIGH (ref 1.7–7.7)
Neutrophils Relative %: 87 %
Platelets: 336 10*3/uL (ref 150–400)
RBC: 2.53 MIL/uL — ABNORMAL LOW (ref 3.87–5.11)
RDW: 17.7 % — ABNORMAL HIGH (ref 11.5–15.5)
WBC: 19.7 10*3/uL — ABNORMAL HIGH (ref 4.0–10.5)
nRBC: 0 % (ref 0.0–0.2)

## 2021-06-04 LAB — BASIC METABOLIC PANEL
Anion gap: 10 (ref 5–15)
BUN: 14 mg/dL (ref 6–20)
CO2: 23 mmol/L (ref 22–32)
Calcium: 9.1 mg/dL (ref 8.9–10.3)
Chloride: 106 mmol/L (ref 98–111)
Creatinine, Ser: 0.9 mg/dL (ref 0.44–1.00)
GFR, Estimated: >60 mL/min (ref >60)
Glucose, Bld: 110 mg/dL — ABNORMAL HIGH (ref 70–99)
Potassium: 4.1 mmol/L (ref 3.5–5.1)
Sodium: 139 mmol/L (ref 135–145)

## 2021-06-04 MED ORDER — SENNOSIDES-DOCUSATE SODIUM 8.6-50 MG PO TABS
2.0000 | ORAL_TABLET | Freq: Every day | ORAL | Status: DC
  Administered 2021-06-06: 2 via ORAL
  Filled 2021-06-04: qty 2

## 2021-06-04 MED ORDER — SIMETHICONE 80 MG PO CHEW
80.0000 mg | CHEWABLE_TABLET | ORAL | Status: DC | PRN
  Filled 2021-06-04: qty 1

## 2021-06-04 MED ORDER — SODIUM CHLORIDE 0.9 % IV SOLN
3.0000 g | Freq: Four times a day (QID) | INTRAVENOUS | Status: DC
  Administered 2021-06-05 – 2021-06-06 (×6): 3 g via INTRAVENOUS
  Filled 2021-06-04 (×4): qty 8
  Filled 2021-06-04: qty 3
  Filled 2021-06-04 (×2): qty 8
  Filled 2021-06-04 (×2): qty 3

## 2021-06-04 MED ORDER — ACETAMINOPHEN 325 MG PO TABS
650.0000 mg | ORAL_TABLET | ORAL | Status: DC | PRN
Start: 2021-06-04 — End: 2021-06-06
  Administered 2021-06-05 – 2021-06-06 (×3): 650 mg via ORAL
  Filled 2021-06-04 (×3): qty 2

## 2021-06-04 MED ORDER — ONDANSETRON HCL 4 MG/2ML IJ SOLN: 4.0000 mg | INTRAMUSCULAR | Status: DC | PRN

## 2021-06-04 MED ORDER — DIBUCAINE (PERIANAL) 1 % EX OINT: 1.0000 "application " | TOPICAL_OINTMENT | CUTANEOUS | Status: DC | PRN

## 2021-06-04 MED ORDER — KETOROLAC TROMETHAMINE 30 MG/ML IJ SOLN
30.0000 mg | Freq: Four times a day (QID) | INTRAMUSCULAR | Status: DC
  Administered 2021-06-05 (×2): 30 mg via INTRAVENOUS
  Filled 2021-06-04 (×2): qty 1

## 2021-06-04 MED ORDER — BENZOCAINE-MENTHOL 20-0.5 % EX AERO: 1.0000 "application " | INHALATION_SPRAY | CUTANEOUS | Status: DC | PRN

## 2021-06-04 MED ORDER — ZOLPIDEM TARTRATE 5 MG PO TABS: 5.0000 mg | ORAL_TABLET | Freq: Every evening | ORAL | Status: DC | PRN

## 2021-06-04 MED ORDER — ONDANSETRON HCL 4 MG PO TABS: 4.0000 mg | ORAL_TABLET | ORAL | Status: DC | PRN

## 2021-06-04 MED ORDER — IBUPROFEN 600 MG PO TABS: 600.0000 mg | ORAL_TABLET | Freq: Four times a day (QID) | ORAL | Status: DC

## 2021-06-04 MED ORDER — WITCH HAZEL-GLYCERIN EX PADS
1.0000 | MEDICATED_PAD | CUTANEOUS | Status: DC | PRN
Start: 2021-06-04 — End: 2021-06-06

## 2021-06-04 MED ORDER — COCONUT OIL OIL: 1.0000 "application " | TOPICAL_OIL | Status: DC | PRN

## 2021-06-04 MED ORDER — PRENATAL MULTIVITAMIN CH
1.0000 | ORAL_TABLET | Freq: Every day | ORAL | Status: DC
  Filled 2021-06-04: qty 1

## 2021-06-04 MED ORDER — METHYLERGONOVINE MALEATE 0.2 MG PO TABS
0.2000 mg | ORAL_TABLET | ORAL | Status: DC
  Administered 2021-06-05 – 2021-06-06 (×7): 0.2 mg via ORAL
  Filled 2021-06-04 (×10): qty 1

## 2021-06-04 MED ORDER — DIPHENHYDRAMINE HCL 25 MG PO CAPS
25.0000 mg | ORAL_CAPSULE | Freq: Four times a day (QID) | ORAL | Status: DC | PRN
Start: 2021-06-04 — End: 2021-06-06

## 2021-06-04 MED ORDER — METHYLERGONOVINE MALEATE 0.2 MG/ML IJ SOLN
0.2000 mg | Freq: Once | INTRAMUSCULAR | Status: AC
Start: 2021-06-04 — End: 2021-06-05
  Administered 2021-06-05: 0.2 mg via INTRAMUSCULAR
  Filled 2021-06-04: qty 1

## 2021-06-04 NOTE — ED Notes (Signed)
New menstrual pad placed on patient to help estimate blood loss per EDP

## 2021-06-04 NOTE — ED Notes (Signed)
Lab coming to re draw type and screen and CBC

## 2021-06-04 NOTE — ED Provider Notes (Signed)
[facility] Provider Note  ____________________________________________   I have reviewed the triage vital signs and the nursing notes.   HISTORY  Chief Complaint Vaginal Bleeding   History limited by: Not Limited   HPI Helen Schneider is a 30 y.o. female who presents to the emergency department today because of concern for vaginal bleeding. The patient had spontaneous vaginal delivery roughly 2 weeks ago. The patient states it was complicated by laceration and postpartum hemorrhage requiring 1 unit of donor blood. She states since then she has not noticed any vaginal bleeding until today. She says she first noticed some bleeding around 5pm. It then got heavier and she started passing clots. Did saturate a pad. Contacted her ob/gyn who recommended ED evaluation. The patient states she has been having some associated pain. She did have some lightheadedness however at the time of my exam the patient states she is feeling somewhat better.    Records reviewed. Per medical record review patient has a history of recent SVD with postpartum hemorrhage with 2nd degree laceration and periurethral lacerations.   Past Medical History:  Diagnosis Date   Constipation 2016   IBS (irritable bowel syndrome) 2016   Iron deficiency anemia     Patient Active Problem List   Diagnosis Date Noted   Preterm premature rupture of membranes (PPROM) with onset of labor within 24 hours of rupture in second trimester, antepartum 01/24/2020   Sickle cell trait (HCC) 11/08/2019   Anxiety and depression 05/16/2018    Past Surgical History:  Procedure Laterality Date   DILATION AND CURETTAGE OF UTERUS  01/27/2020   for removal of placenta   WISDOM TOOTH EXTRACTION  2013    Prior to Admission medications   Medication Sig Start Date End Date Taking? Authorizing Provider  Ascorbic Acid (VITAMIN C PO) Take by mouth.    [provider]  Ferrous Sulfate (IRON)  325 (65 Fe) MG TABS Take by mouth.    [provider]  naproxen (NAPROSYN) 375 MG tablet Take 1 tablet (375 mg total) by mouth 2 (two) times daily with a meal. 08/29/20   Wallis Bamberg, PA-C  Prenatal Vit-Fe Fumarate-FA (PRENATAL VITAMIN PO) Take by mouth.    [provider]  Probiotic Product (GNP 4X PROBIOTIC) TABS Take by mouth.    [provider]    Allergies Kiwi extract, Cherry, and Mushroom extract complex  Family History  Problem Relation Age of Onset   Multiple sclerosis Father    Lung cancer Maternal Grandfather    Breast cancer Neg Hx    Ovarian cancer Neg Hx    Colon cancer Neg Hx     Social History Social History   Tobacco Use   Smoking status: Never   Smokeless tobacco: Never  Vaping Use   Vaping Use: Never used  Substance Use Topics   Alcohol use: Never   Drug use: Never    Review of Systems Constitutional: No fever/chills Eyes: No visual changes. ENT: No sore throat. Cardiovascular: Denies chest pain. Respiratory: Denies shortness of breath. Gastrointestinal: No abdominal pain.  No nausea, no vomiting.  No diarrhea.   Genitourinary: Positive for vaginal bleeding and discomfort.  Musculoskeletal: Negative for back pain. Skin: Negative for rash. Neurological: Negative for headaches, focal weakness or numbness.  ____________________________________________   PHYSICAL EXAM:  VITAL SIGNS: ED Triage Vitals  Enc Vitals Group     BP 06/04/21 2056 119/84     Pulse Rate 06/04/21 2056 (!) 112  Resp 06/04/21 2056 20     Temp 06/04/21 2056 98.7 F (37.1 C)     Temp Source 06/04/21 2056 Oral     SpO2 06/04/21 2056 100 %     Weight 06/04/21 2057 145 lb (65.8 kg)     Height 06/04/21 2057 5\' 6"  (1.676 m)     Head Circumference --      Peak Flow --      Pain Score 06/04/21 2057 6   Constitutional: Alert and oriented.  Eyes: Conjunctivae are normal.  ENT      Head: Normocephalic and atraumatic.      Nose: No  congestion/rhinnorhea.      Mouth/Throat: Mucous membranes are moist.      Neck: No stridor. Hematological/Lymphatic/Immunilogical: No cervical lymphadenopathy. Cardiovascular: Normal rate, regular rhythm.  No murmurs, rubs, or gallops.  Respiratory: Normal respiratory effort without tachypnea nor retractions. Breath sounds are clear and equal bilaterally. No wheezes/rales/rhonchi. Gastrointestinal: Soft and non tender. No rebound. No guarding.  Genitourinary: Deferred Musculoskeletal: Normal range of motion in all extremities. No lower extremity edema. Neurologic:  Normal speech and language. No gross focal neurologic deficits are appreciated.  Skin:  Skin is warm, dry and intact. No rash noted. Psychiatric: Mood and affect are normal. Speech and behavior are normal. Patient exhibits appropriate insight and judgment.  ____________________________________________    LABS (pertinent positives/negatives)  CBC wbc 19.7, hgb 7.9, plt 336 BMP wnl except glu 110  ____________________________________________   EKG  None  ____________________________________________    RADIOLOGY  2058 pelvis Thickened endometrium concerning for retained products of conception.  ____________________________________________   PROCEDURES  Procedures  ____________________________________________   INITIAL IMPRESSION / ASSESSMENT AND PLAN / ED COURSE  Pertinent labs & imaging results that were available during my care of the patient were reviewed by me and considered in my medical decision making (see chart for details).   Patient presented to the emergency department today because of concerns for vaginal bleeding.  Patient had spontaneous vaginal delivery roughly 2 weeks ago.  Blood work does show anemia however patient had roughly same blood level at time of discharge.  Her delivery was complicated by postpartum hemorrhage requiring 1 unit transfusion.  Patient's ultrasound here is concerning for  retained products of conception. Vital signs remained stable here in the emergency department.  Discussed with Dr. [place] with OB/GYN who will plan on admission.  Discussed findings and plan with patient and husband.  ___________________________________________   FINAL CLINICAL IMPRESSION(S) / ED DIAGNOSES  Final diagnoses:  Vaginal bleeding  Retained products of conception with hemorrhage     Note: This dictation was prepared with Dragon dictation. Any transcriptional errors that result from this process are unintentional     Dalbert Garnet, MD 06/04/21 2310

## 2021-06-04 NOTE — H&P (Signed)
Consult History and Physical   SERVICE: Gynecology   Patient Name: Helen Schneider Patient MRN:   [id number]  CC: admitted with new onset heavier bleeding that started today around 5pm, she saturated pads and starting passing clots, she has had one clot since arrival to ER that was the size of her fist.    HPI: Helen Schneider is a 30 y.o. [id number] with delayed PP hemorrhage after delivery on 05/22/21 at [facility].  - Recent pregnancy complicated by:  Preterm Birth at 22wks with G1 followed by neonatal demise at 2 weeks. Cerclage this pregnancy.  Sickle cell trait Umbilical hernia Postpartum Hemorrhage Stage 2- given blood, medications and TXA at delivery for bleeding from vaginal and periurethral lacerations.  Depression  Review of Systems: positives in bold GEN:   fevers, chills, weight changes, appetite changes, fatigue, night sweats HEENT:  HA, vision changes, hearing loss, congestion, rhinorrhea, sinus pressure, dysphagia CV:   CP, palpitations PULM:  SOB, cough GI:  abd pain, N/V/D/C GU:  dysuria, urgency, frequency, Vaginal bleeding with large blood clots and cramping.  MSK:  arthralgias, myalgias, back pain, swelling SKIN:  rashes, color changes, pallor NEURO:  numbness, weakness, tingling, seizures, dizziness, tremors PSYCH:  depression, anxiety, behavioral problems, confusion  HEME/LYMPH:  easy bruising or bleeding ENDO:  heat/cold intolerance  Past Obstetrical History: OB History     Gravida  1   Para  1   Term      Preterm  1   AB      Living         SAB      IAB      Ectopic      Multiple      Live Births  1        Obstetric Comments  Required curettage for removal of placenta         Past Gynecologic History: No LMP recorded.   Past Medical History: Past Medical History:  Diagnosis Date   Constipation 2016   IBS (irritable bowel syndrome) 2016   Iron deficiency anemia     Past Surgical History:   Past Surgical History:   Procedure Laterality Date   DILATION AND CURETTAGE OF UTERUS  01/27/2020   for removal of placenta   WISDOM TOOTH EXTRACTION  2013    Family History:  family history includes Lung cancer in her maternal grandfather; Multiple sclerosis in her father.  Social History:  Social History   Socioeconomic History   Marital status: Married    Spouse name: Do'trell   Number of children: 0   Years of education: Not on file   Highest education level: Not on file  Occupational History   Not on file  Tobacco Use   Smoking status: Never   Smokeless tobacco: Never  Vaping Use   Vaping Use: Never used  Substance and Sexual Activity   Alcohol use: Never   Drug use: Never   Sexual activity: Yes    Birth control/protection: None  Other Topics Concern   Not on file  Social History Narrative   Not on file   Social Determinants of Health   Financial Resource Strain: Not on file  Food Insecurity: Not on file  Transportation Needs: Not on file  Physical Activity: Not on file  Stress: Not on file  Social Connections: Not on file  Intimate Partner Violence: Not on file    Home Medications:  Medications reconciled in EPIC  No current facility-administered medications on file prior  to encounter.   Current Outpatient Medications on File Prior to Encounter  Medication Sig Dispense Refill   Prenatal Vit-Fe Fumarate-FA (PRENATAL VITAMIN PO) Take by mouth.     Ascorbic Acid (VITAMIN C PO) Take by mouth.     Ferrous Sulfate (IRON) 325 (65 Fe) MG TABS Take by mouth.     ibuprofen (ADVIL) 600 MG tablet Take 600 mg by mouth every 6 (six) hours.     naproxen (NAPROSYN) 375 MG tablet Take 1 tablet (375 mg total) by mouth 2 (two) times daily with a meal. 30 tablet 0   norethindrone (MICRONOR) 0.35 MG tablet Take 1 tablet by mouth daily.     Probiotic Product (GNP 4X PROBIOTIC) TABS Take by mouth.      Allergies:  Allergies  Allergen Reactions   Kiwi Extract    Cherry Hives   Mushroom  Extract Complex Hives    Physical Exam:  Temp:  [98.7 F (37.1 C)] 98.7 F (37.1 C) (09/14 2056) Pulse Rate:  [85-112] 86 (09/14 2300) Resp:  [13-20] 14 (09/14 2300) BP: (102-119)/(76-84) 112/76 (09/14 2300) SpO2:  [100 %] 100 % (09/14 2300) Weight:  [65.8 kg] 65.8 kg (09/14 2057)   General Appearance:  Well developed, well nourished, no acute distress, alert and oriented x3 HEENT:  Normocephalic atraumatic, extraocular movements intact, moist mucous membranes Cardiovascular:  Normal S1/S2, regular rate and rhythm, no murmurs Pulmonary:  clear to auscultation, no wheezes, rales or rhonchi, symmetric air entry, good air exchange Abdomen:  Bowel sounds present, soft, nontender, nondistended, no abnormal masses, no epigastric pain Extremities:  Full range of motion, no pedal edema, 2+ distal pulses, no tenderness Skin:  normal coloration and turgor, no rashes, no suspicious skin lesions noted  Psychiatric:  Normal mood and affect, appropriate, no AH/VH Pelvic:  Fundus very tender to light palpation, 50fb below umbilicus, pt reports moderate cramping. Lochia dark red, light flow, no clots. Pelvic exam deferred. No labial or perineal swelling.    Labs/Studies:   CBC and Coags:  Lab Results  Component Value Date   WBC 19.7 (H) 06/04/2021   NEUTOPHILPCT 87 06/04/2021   EOSPCT 0 06/04/2021   BASOPCT 0 06/04/2021   LYMPHOPCT 7 06/04/2021   HGB 7.9 (L) 06/04/2021   HCT 24.1 (L) 06/04/2021   MCV 95.3 06/04/2021   PLT 336 06/04/2021   CMP:  Lab Results  Component Value Date   NA 139 06/04/2021   K 4.1 06/04/2021   CL 106 06/04/2021   CO2 23 06/04/2021   BUN 14 06/04/2021   CREATININE 0.90 06/04/2021   CREATININE 1.04 (H) 05/29/2020   PROT 7.4 05/29/2020   BILITOT 0.9 05/29/2020   ALT 9 05/29/2020   AST 17 05/29/2020   ALKPHOS 53 05/29/2020     TVUS:   Other Imaging: US Pelvis Complete  Result Date: 06/04/2021 CLINICAL DATA:  Vaginal bleeding. Vaginal delivery on  05/22/2021 complicated by postpartum hemorrhage. Large clots passing through. Cramps. EXAM: TRANSABDOMINAL ULTRASOUND OF PELVIS TECHNIQUE: Transabdominal ultrasound examination of the pelvis was performed including evaluation of the uterus, ovaries, adnexal regions, and pelvic cul-de-sac. COMPARISON:  None. FINDINGS: Uterus Measurements: 10.8 x 7.4 x 10.6 cm = volume: 441 mL. Enlarged postpartum uterus with increased vascularity throughout the myometrium. No fibroids or other mass visualized. Endometrium Thickness: 34 mm. Thickened and heterogeneous with increased vascularity. Right ovary Measurements: 5.1 x 1.2 x 2.2 cm = volume: 9 mL. Normal appearance/no adnexal mass. Left ovary Measurements: 3.5 x 1 x 2.2 cm =  volume: 4 mL. Normal appearance/no adnexal mass. Other findings:  No abnormal free fluid. IMPRESSION: Thickened vascular endometrium (34 mm) suggestive of retained products of conception and blood clots. Nonspecific adjacent increased vascularity of the myometrium. Electronically Signed   By: Tish Frederickson M.D.   On: 06/04/2021 22:43     Assessment / Plan:   Helen Schneider is a 30 y.o. G1P0100 who presents with delayed PP hemorrhage  1. D/w Dr Dalbert Garnet, pt to NPO after midnight with planned D&C in AM.  2. PP endometritis: fundal tenderness and subinvolution, WBC elevated, will start Unasyn 3gm IVPB q6h, Methergine for bleeding mgmt.  3. Routine pp vaginal delivery orders, lactation prn and breast pump to bedside for breastfeeding mother, tylenol and toradol for pain mgmt.   Randa Ngo, CNM 06/04/2021 11:53 PM   Thank you for the opportunity to be involved with this pt's care.

## 2021-06-04 NOTE — ED Triage Notes (Signed)
Pt had vaginal delivery 05/22/21 at [facility], pt states she hemmorhaged afterwards.  Today.  Pt began having vaginal bleeding  with cramps and is passing large blood clots.  Pt was transfused after delivery per pt.  Pt alert.

## 2021-06-04 NOTE — Consult Note (Signed)
Pharmacy Antibiotic Note  Helen Schneider is a 30 y.o. female admitted on 06/04/2021 with delayed PP hemorrhage and leukocytosis following delivery 05/22/21.  Pharmacy has been consulted for Unsayn dosing.  Plan: Unasyn 3 gram Q6H  Height: 5\' 6"  (167.6 cm) Weight: 65.8 kg (145 lb) IBW/kg (Calculated) : 59.3  Temp (24hrs), Avg:98.7 F (37.1 C), Min:98.7 F (37.1 C), Max:98.7 F (37.1 C)  Recent Labs  Lab 06/04/21 2108 06/04/21 2231  WBC  --  19.7*  CREATININE 0.90  --     Estimated Creatinine Clearance: 85.6 mL/min (by C-G formula based on SCr of 0.9 mg/dL).    Allergies  Allergen Reactions   Kiwi Extract    Cherry Hives   Mushroom Extract Complex Hives    Antimicrobials this admission: 9/15 unasyn >>    Microbiology results:   Thank you for allowing pharmacy to be a part of this patient's care.  10/15, PharmD, BCPS Clinical Pharmacist   06/04/2021 11:42 PM

## 2021-06-04 NOTE — ED Notes (Addendum)
Report provided to RN for room 347. Pt taken up by Britt Boozer, RN in stretcher. All belongings sent with pt.

## 2021-06-05 ENCOUNTER — Inpatient Hospital Stay: Payer: Medicaid Other | Admitting: Anesthesiology

## 2021-06-05 ENCOUNTER — Encounter: Payer: Self-pay | Admitting: Obstetrics and Gynecology

## 2021-06-05 ENCOUNTER — Encounter
Admission: EM | Disposition: A | Discharge status: Home / Self Care | Payer: Self-pay | Source: Home / Self Care | Attending: Obstetrics and Gynecology

## 2021-06-05 HISTORY — PX: PERINEUM REPAIR: SHX2219

## 2021-06-05 HISTORY — PX: DILATION AND CURETTAGE OF UTERUS: SHX78

## 2021-06-05 LAB — CBC
HCT: 22.2 % — ABNORMAL LOW (ref 36.0–46.0)
HCT: 19.8 % — ABNORMAL LOW (ref 36.0–46.0)
HCT: 29.2 % — ABNORMAL LOW (ref 36.0–46.0)
Hemoglobin: 7.3 g/dL — ABNORMAL LOW (ref 12.0–15.0)
Hemoglobin: 6.6 g/dL — ABNORMAL LOW (ref 12.0–15.0)
Hemoglobin: 10.3 g/dL — ABNORMAL LOW (ref 12.0–15.0)
MCH: 31.3 pg (ref 26.0–34.0)
MCH: 31.3 pg (ref 26.0–34.0)
MCH: 32.7 pg (ref 26.0–34.0)
MCHC: 32.9 g/dL (ref 30.0–36.0)
MCHC: 33.3 g/dL (ref 30.0–36.0)
MCHC: 35.3 g/dL (ref 30.0–36.0)
MCV: 95.3 fL (ref 80.0–100.0)
MCV: 93.8 fL (ref 80.0–100.0)
MCV: 92.7 fL (ref 80.0–100.0)
Platelets: 328 10*3/uL (ref 150–400)
Platelets: 264 10*3/uL (ref 150–400)
Platelets: 270 10*3/uL (ref 150–400)
RBC: 2.33 MIL/uL — ABNORMAL LOW (ref 3.87–5.11)
RBC: 2.11 MIL/uL — ABNORMAL LOW (ref 3.87–5.11)
RBC: 3.15 MIL/uL — ABNORMAL LOW (ref 3.87–5.11)
RDW: 17.6 % — ABNORMAL HIGH (ref 11.5–15.5)
RDW: 17.5 % — ABNORMAL HIGH (ref 11.5–15.5)
RDW: 16.3 % — ABNORMAL HIGH (ref 11.5–15.5)
WBC: 19.5 10*3/uL — ABNORMAL HIGH (ref 4.0–10.5)
WBC: 6.9 10*3/uL (ref 4.0–10.5)
WBC: 18.2 10*3/uL — ABNORMAL HIGH (ref 4.0–10.5)
nRBC: 0 % (ref 0.0–0.2)
nRBC: 0 % (ref 0.0–0.2)
nRBC: 0 % (ref 0.0–0.2)

## 2021-06-05 LAB — PREPARE RBC (CROSSMATCH)

## 2021-06-05 LAB — FIBRINOGEN: Fibrinogen: 226 mg/dL (ref 210–475)

## 2021-06-05 LAB — RESP PANEL BY RT-PCR (FLU A&B, COVID) ARPGX2
Influenza A by PCR: NEGATIVE
Influenza B by PCR: NEGATIVE
SARS Coronavirus 2 by RT PCR: NEGATIVE

## 2021-06-05 LAB — APTT: aPTT: 32 seconds (ref 24–36)

## 2021-06-05 LAB — PROTIME-INR
INR: 1.2 (ref 0.8–1.2)
Prothrombin Time: 14.8 seconds (ref 11.4–15.2)

## 2021-06-05 SURGERY — DILATION AND CURETTAGE
Anesthesia: General

## 2021-06-05 MED ORDER — FENTANYL CITRATE (PF) 100 MCG/2ML IJ SOLN
INTRAMUSCULAR | Status: DC | PRN
  Administered 2021-06-05 (×2): 25 ug via INTRAVENOUS
  Administered 2021-06-05: 50 ug via INTRAVENOUS

## 2021-06-05 MED ORDER — MISOPROSTOL 200 MCG PO TABS
ORAL_TABLET | ORAL | Status: AC
  Filled 2021-06-05: qty 4

## 2021-06-05 MED ORDER — GLYCOPYRROLATE 0.2 MG/ML IJ SOLN
INTRAMUSCULAR | Status: DC | PRN
  Administered 2021-06-05: .2 mg via INTRAVENOUS

## 2021-06-05 MED ORDER — PROPOFOL 10 MG/ML IV BOLUS
INTRAVENOUS | Status: DC | PRN
Start: 2021-06-05 — End: 2021-06-05
  Administered 2021-06-05: 50 mg via INTRAVENOUS
  Administered 2021-06-05: 30 mg via INTRAVENOUS

## 2021-06-05 MED ORDER — PHENYLEPHRINE HCL (PRESSORS) 10 MG/ML IV SOLN
INTRAVENOUS | Status: DC | PRN
  Administered 2021-06-05: 100 ug via INTRAVENOUS

## 2021-06-05 MED ORDER — FENTANYL CITRATE (PF) 100 MCG/2ML IJ SOLN
25.0000 ug | INTRAMUSCULAR | Status: DC | PRN
  Administered 2021-06-05: 25 ug via INTRAVENOUS
  Administered 2021-06-05: 50 ug via INTRAVENOUS

## 2021-06-05 MED ORDER — TRANEXAMIC ACID-NACL 1000-0.7 MG/100ML-% IV SOLN
INTRAVENOUS | Status: DC | PRN
  Administered 2021-06-05: 1000 mg via INTRAVENOUS

## 2021-06-05 MED ORDER — DEXAMETHASONE SODIUM PHOSPHATE 10 MG/ML IJ SOLN
INTRAMUSCULAR | Status: DC | PRN
  Administered 2021-06-05: 10 mg via INTRAVENOUS

## 2021-06-05 MED ORDER — TRANEXAMIC ACID-NACL 1000-0.7 MG/100ML-% IV SOLN
1000.0000 mg | Freq: Once | INTRAVENOUS | Status: AC
  Administered 2021-06-05: 1000 mg via INTRAVENOUS
  Filled 2021-06-05: qty 100

## 2021-06-05 MED ORDER — METHYLERGONOVINE MALEATE 0.2 MG/ML IJ SOLN
INTRAMUSCULAR | Status: AC
  Filled 2021-06-05: qty 1

## 2021-06-05 MED ORDER — ONDANSETRON HCL 4 MG/2ML IJ SOLN
INTRAMUSCULAR | Status: DC | PRN
  Administered 2021-06-05: 4 mg via INTRAVENOUS

## 2021-06-05 MED ORDER — MIDAZOLAM HCL 2 MG/2ML IJ SOLN
INTRAMUSCULAR | Status: DC | PRN
  Administered 2021-06-05: 2 mg via INTRAVENOUS

## 2021-06-05 MED ORDER — SODIUM CHLORIDE 0.9 % IV SOLN
200.0000 mg | Freq: Once | INTRAVENOUS | Status: DC
  Filled 2021-06-05: qty 10

## 2021-06-05 MED ORDER — LACTATED RINGERS IV SOLN: INTRAVENOUS | Status: DC

## 2021-06-05 MED ORDER — MIDAZOLAM HCL 2 MG/2ML IJ SOLN
INTRAMUSCULAR | Status: AC
  Filled 2021-06-05: qty 2

## 2021-06-05 MED ORDER — ACETAMINOPHEN 10 MG/ML IV SOLN
INTRAVENOUS | Status: AC
  Filled 2021-06-05: qty 100

## 2021-06-05 MED ORDER — TRANEXAMIC ACID 1000 MG/10ML IV SOLN
INTRAVENOUS | Status: AC
  Filled 2021-06-05: qty 10

## 2021-06-05 MED ORDER — ALBUMIN HUMAN 5 % IV SOLN
INTRAVENOUS | Status: AC
  Filled 2021-06-05: qty 250

## 2021-06-05 MED ORDER — FENTANYL CITRATE (PF) 100 MCG/2ML IJ SOLN
INTRAMUSCULAR | Status: AC
  Administered 2021-06-05: 25 ug via INTRAVENOUS
  Filled 2021-06-05: qty 2

## 2021-06-05 MED ORDER — SODIUM CHLORIDE 0.9 % IV SOLN
INTRAVENOUS | Status: DC | PRN
  Administered 2021-06-05: 500 mL via INTRAVENOUS

## 2021-06-05 MED ORDER — CLINDAMYCIN PHOSPHATE 900 MG/50ML IV SOLN
900.0000 mg | Freq: Three times a day (TID) | INTRAVENOUS | Status: DC
  Administered 2021-06-05 – 2021-06-06 (×3): 900 mg via INTRAVENOUS
  Filled 2021-06-05 (×7): qty 50

## 2021-06-05 MED ORDER — FENTANYL CITRATE (PF) 100 MCG/2ML IJ SOLN
INTRAMUSCULAR | Status: AC
  Filled 2021-06-05: qty 2

## 2021-06-05 MED ORDER — MISOPROSTOL 100 MCG PO TABS
ORAL_TABLET | ORAL | Status: DC | PRN
  Administered 2021-06-05: 800 ug via RECTAL

## 2021-06-05 MED ORDER — SODIUM CHLORIDE 0.9 % IV SOLN
200.0000 mg | Freq: Once | INTRAVENOUS | Status: AC
  Administered 2021-06-05: 200 mg via INTRAVENOUS
  Filled 2021-06-05: qty 10

## 2021-06-05 MED ORDER — LIDOCAINE HCL (CARDIAC) PF 100 MG/5ML IV SOSY
PREFILLED_SYRINGE | INTRAVENOUS | Status: DC | PRN
  Administered 2021-06-05: 100 mg via INTRAVENOUS

## 2021-06-05 MED ORDER — ALBUMIN HUMAN 5 % IV SOLN
INTRAVENOUS | Status: DC | PRN
Start: 2021-06-05 — End: 2021-06-05

## 2021-06-05 MED ORDER — METHYLERGONOVINE MALEATE 0.2 MG/ML IJ SOLN
INTRAMUSCULAR | Status: DC | PRN
  Administered 2021-06-05: .2 mg via INTRAMUSCULAR

## 2021-06-05 MED ORDER — OXYCODONE HCL 5 MG/5ML PO SOLN: 5.0000 mg | Freq: Once | ORAL | Status: DC | PRN

## 2021-06-05 MED ORDER — POVIDONE-IODINE 10 % EX SWAB: 2.0000 "application " | Freq: Once | CUTANEOUS | Status: DC

## 2021-06-05 MED ORDER — OXYCODONE HCL 5 MG PO TABS: 5.0000 mg | ORAL_TABLET | Freq: Once | ORAL | Status: DC | PRN

## 2021-06-05 MED ORDER — PROPOFOL 500 MG/50ML IV EMUL
INTRAVENOUS | Status: DC | PRN
  Administered 2021-06-05: 125 ug/kg/min via INTRAVENOUS

## 2021-06-05 SURGICAL SUPPLY — 25 items
BAG URINE DRAIN 2000ML AR STRL (UROLOGICAL SUPPLIES) ×3 IMPLANT
CATH FOLEY 3WAY 30CC 18FR (CATHETERS) ×3 IMPLANT
CATH ROBINSON RED A/P 16FR (CATHETERS) ×3 IMPLANT
COVER LIGHT HANDLE STERIS (MISCELLANEOUS) ×6 IMPLANT
GAUZE 4X4 16PLY ~~LOC~~+RFID DBL (SPONGE) ×6 IMPLANT
GLOVE SURG ENC MOIS LTX SZ7 (GLOVE) ×3 IMPLANT
GLOVE SURG UNDER LTX SZ7.5 (GLOVE) ×3 IMPLANT
GOWN STRL REUS W/ TWL LRG LVL3 (GOWN DISPOSABLE) ×4 IMPLANT
GOWN STRL REUS W/TWL LRG LVL3 (GOWN DISPOSABLE) ×2
KIT TURNOVER CYSTO (KITS) ×3 IMPLANT
MANIFOLD NEPTUNE II (INSTRUMENTS) ×3 IMPLANT
PACK DNC HYST (MISCELLANEOUS) ×3 IMPLANT
PAD OB MATERNITY 4.3X12.25 (PERSONAL CARE ITEMS) ×3 IMPLANT
PAD PREP 24X41 OB/GYN DISP (PERSONAL CARE ITEMS) ×3 IMPLANT
SCRUB EXIDINE 4% CHG 4OZ (MISCELLANEOUS) ×3 IMPLANT
SOL PREP PVP 2OZ (MISCELLANEOUS) ×3
SOLUTION PREP PVP 2OZ (MISCELLANEOUS) ×2 IMPLANT
SUT VIC AB 2-0 CT1 (SUTURE) ×6 IMPLANT
SUT VIC AB 3-0 SH 27 (SUTURE) ×2
SUT VIC AB 3-0 SH 27X BRD (SUTURE) ×4 IMPLANT
SYR 30ML LL (SYRINGE) ×3 IMPLANT
TRAY FOLEY MTR SLVR 16FR STAT (SET/KITS/TRAYS/PACK) ×3 IMPLANT
TUBING ART PRESS 48 MALE/FEM (TUBING) ×3 IMPLANT
VACURETTE 8 RIGID CVD (CANNULA) ×3 IMPLANT
WATER STERILE IRR 500ML POUR (IV SOLUTION) ×3 IMPLANT

## 2021-06-05 NOTE — Progress Notes (Signed)
Pt seen and examined.  S/S of endometritis with retained products of conception. Bleeding improved overnight on methergine and abx.   WBC elevated but stable at 19.5 from 19.7 on admission. Hgb 7.3 from 7.9 on admission.  Plan for postpartum D&C. On unysyn currently and will d/c home with augmentin with close followup.  Anemia: PO iron for home. IV iron infusion while in house.

## 2021-06-05 NOTE — Transfer of Care (Signed)
Immediate Anesthesia Transfer of Care Note  Patient: Helen Schneider  Procedure(s) Performed: DILATATION AND CURETTAGE EPISIOTOMY REPAIR REVISION  Patient Location: PACU  Anesthesia Type:MAC  Level of Consciousness: awake, drowsy and patient cooperative  Airway & Oxygen Therapy: Patient Spontanous Breathing and Patient connected to face mask oxygen  Post-op Assessment: Report given to RN and Post -op Vital signs reviewed and stable  Post vital signs: Reviewed and stable  Last Vitals:  Vitals Value Taken Time  BP 137/94 06/05/21 1617  Temp    Pulse 59 06/05/21 1623  Resp 16 06/05/21 1623  SpO2 100 % 06/05/21 1623  Vitals shown include unvalidated device data.  Last Pain:  Vitals:   06/05/21 1355  TempSrc: Temporal  PainSc: 3          Complications: No notable events documented.

## 2021-06-05 NOTE — Progress Notes (Signed)
Day of Surgery Procedure(s): DILATATION AND CURETTAGE (N/A) EPISIOTOMY REPAIR REVISION Subjective: Feeling crampy, minimal pain No further blood in the Schneider bag in uterus Hgb intraop 6.6 with normal platelets, normal fibrinogen and INR S/p 2 u prbcs   Objective: Vital signs in last 24 hours: Temp:  [98.1 F (36.7 C)-99.2 F (37.3 C)] 98.4 F (36.9 C) (09/15 1752) Pulse Rate:  [60-112] 60 (09/15 1752) Resp:  [11-20] 20 (09/15 1752) BP: (102-178)/(62-105) 140/92 (09/15 1752) SpO2:  [93 %-100 %] 98 % (09/15 1752) Weight:  [65.8 kg] 65.8 kg (09/14 2057)  Intake/Output  Intake/Output Summary (Last 24 hours) at 06/05/2021 1828 Last data filed at 06/05/2021 1700 Gross per 24 hour  Intake 2480 ml  Output 2300 ml  Net 180 ml    Physical Exam:  General: Alert and oriented. Tearful CV: RRR Lungs: Clear bilaterally. GI: Soft, Nondistended. Urine: Clear Extremities: Nontender, no erythema, no edema.  Assessment/Plan: POD# 0 s/p Procedure(s): DILATATION AND CURETTAGE (N/A) EPISIOTOMY REPAIR REVISION.  - Repeat dose of TXA x1 now.  - repeat CBC at 2100 and in the am - Broaden abx to cover anaerobes clinda - regular diet now - plan for removal of Schneider balloon in the am, with vascular surgery on call for uterine artery embolization if needed - continue breastfeeding support -   Christeen Douglas, MD   LOS: 1 day   Christeen Douglas 06/05/2021, 6:28 PM  Patient ID: Helen Schneider, female   DOB: 1991-04-03, 30 y.o.   MRN: [id number]

## 2021-06-05 NOTE — Op Note (Signed)
Helen Schneider PROCEDURE DATE:  06/05/2021  PREOPERATIVE DIAGNOSIS: Retained products of conception POSTOPERATIVE DIAGNOSIS: The same + uterine hemorrhage + disruption of the perineal laceration (this was present at the start of the case and not caused by the examination) PROCEDURE:   Suction D&C with revision of delivery perineal laceration, placement of uterine tamponade balloon; management of uterine hemorrhage  SURGEON:  Dr. Christeen Douglas  INDICATIONS: 30 y.o.  [id number] with retained products of conception and delayed hemorrhage after SVD at [facility] on 05/22/21, needing emergent surgical completion.  She presented to the ER with heavy bleeding and retained products of conception suspected on ultrasound. Her WBC were elevated and she did have significant uterine tenderness. She was started on iv abx for presumed endometritis and taken to the OR about 12 hours later, as her bleeding had significantly slowed after her initial expulsion of a large clot during transvaginal ultrasound.  Risks of surgery were discussed with the patient and her partner including but not limited to: bleeding which may require transfusion; infection which may require antibiotics; injury to uterus or surrounding organs; need for additional procedures including laparotomy or laparoscopy; possibility of intrauterine scarring which may impair future fertility; and other postoperative/anesthesia complications. Written informed consent was obtained.  FINDINGS:  small 8 week sized midline boggy uterus, open cervix with dark clots at the external os. As soon as these were evacuated, she began to hemorrhage and bled very quickly bright red blood. The hemorrhage protocol was started. Her uterus remained small but boggy throughout.  The left perineal and vaginal lacerations were examined. The left fourchette laceration was no longer approximated and there was a bridge of vicryl suture over the granulation tissue. After the placement of the  uterine tamponade balloon, this suture was removed sharply and the edges of the obstetric laceration freshened and brought together with 3 interrupted stiches of vicryl for excellent reapproximation. Minimal bleeding at this site.  Intrauterine culture taken directly during the case. 30cc Foley balloon remained in place at the close of the procedure. Coags were pending at the close of the case She did receive 2u pRBCs  ANESTHESIA:   IV sedation INTRAVENOUS FLUIDS:  600 ml of LR; 2u pRBCs; 500cc of albumin Intraoperative medications: 0.2mg  IM methergine; 1g IV TXA over 10 min; 800 mcg rectal misoprostol  ESTIMATED BLOOD LOSS:  600 ml SPECIMENS:  Products of conception fragments sent to pathology; uterine aerobic and anaerobic cultures sent COMPLICATIONS:  None immediate.  PROCEDURE DETAILS:  The patient was then taken to the operating room where anesthesia was administered and was found to be adequate.  After an adequate timeout was performed, she was placed in the dorsal lithotomy position and examined; then prepped and draped in the sterile manner.   A vaginal speculum was then placed in the patient's vagina and a ring forceps was applied to the anterior lip of the cervix.  The cervix was already about 3 cm dilated; forceps were advanced into the uterus to grasp and remove the products and clots in fragments under ultrasound guidance.  A 7 mm rigid suction curette was then advanced into the uterus, the suction device was then activated and curette slowly rotated to clear further placental fragments.    At this point, there was significant bleeding noted so IV TXA was started. As her bleeding continued, IM Methergine and PR Misoprostol 800 mcg was administered.  Bimanual pressure was held while these actions were taken, which limited the blood loss significantly. However, it did return  when pressure released. I could feel easily to her fundus and her uterine was boggy but small, without clots or  retained products.   The bleeding remained significantly brisk, and 2u of pRBCs were sent for (her starting hcg was 7.9) and albumin hung.  A 30cc Foley balloon was inflated with NS in the uterus and bleeding was noted to slow significantly. We waited a full 15 min and slowly removed the balloon. Bleeding was slow for about 3 minutes, but returned as briskly as before. I replaced the foley balloon into the uterus but this time, her bleeding did not slow, and indeed overflowed to come out of the vagina as well. She bled about 100cc into the Foley bag and we determine to trial the [place] device. By the time it was set up, her bleeding stopped and we did not place the [place]. We watched for about 20 more minutes without return of vaginal bleeding while the FOley balloon was in place, and therefore I placed a bladder foley and we stopped the case.  We will monitor bleeding carefully and my next step would be uterine artery embolization if possible. Hopefully the uterotonics and the antibiotics will work to give her uterus time to tamponade. Consideration of AVM is possible, though she did not have instrumentation at the time of her delivery and that would be rare.  All instruments were removed from the patient's vagina.  Sponge and instrument counts were correct times two.  The patient tolerated the procedure well and was taken to the recovery area awake, extubated and in stable condition.  She is breastfeeding and will return to her room for monitoring at least overnight. Will leave the Foley in place for <24hrs. Will continue the abx with plans to send her home on empiric Augmentin.

## 2021-06-05 NOTE — Progress Notes (Signed)
   06/05/21 1140  Clinical Encounter Type  Visited With Patient and family together  Visit Type Follow-up;Spiritual support  Referral From Nurse  Consult/Referral To Chaplain  Spiritual Encounters  Spiritual Needs Emotional  Chaplain Oleta Mouse responded to an OR for room 347, womens health. Chaplain provided emotional and spiritual support. Chaplain discuss AD with pt and husband. Pt did not want AD at this time.

## 2021-06-05 NOTE — Anesthesia Preprocedure Evaluation (Signed)
Anesthesia Evaluation  Patient identified by MRN, date of birth, ID band Patient awake    Reviewed: Allergy & Precautions, NPO status , Patient's Chart, lab work & pertinent test results, reviewed documented beta blocker date and time   History of Anesthesia Complications Negative for: history of anesthetic complications  Airway Mallampati: I       Dental no notable dental hx.    Pulmonary    Pulmonary exam normal        Cardiovascular Exercise Tolerance: Good Normal cardiovascular examI     Neuro/Psych    GI/Hepatic   Endo/Other    Renal/GU      Musculoskeletal   Abdominal Normal abdominal exam  (+)   Peds  Hematology   Anesthesia Other Findings   Reproductive/Obstetrics                             Anesthesia Physical Anesthesia Plan  ASA: 1  Anesthesia Plan: General   Post-op Pain Management:    Induction: Intravenous  PONV Risk Score and Plan:   Airway Management Planned: Natural Airway, Simple Face Mask and LMA  Additional Equipment: None  Intra-op Plan:   Post-operative Plan: Extubation in OR  Informed Consent: I have reviewed the patients History and Physical, chart, labs and discussed the procedure including the risks, benefits and alternatives for the proposed anesthesia with the patient or authorized representative who has indicated his/her understanding and acceptance.       Plan Discussed with: CRNA  Anesthesia Plan Comments:         Anesthesia Quick Evaluation

## 2021-06-05 NOTE — Progress Notes (Signed)
Vaginal foley irrigated for clots.

## 2021-06-05 NOTE — Anesthesia Postprocedure Evaluation (Signed)
Anesthesia Post Note  Patient: Helen Schneider  Procedure(s) Performed: DILATATION AND CURETTAGE EPISIOTOMY REPAIR REVISION  Patient location during evaluation: PACU Anesthesia Type: General Level of consciousness: awake and alert Pain management: pain level controlled Vital Signs Assessment: post-procedure vital signs reviewed and stable Respiratory status: spontaneous breathing, nonlabored ventilation, respiratory function stable and patient connected to nasal cannula oxygen Cardiovascular status: blood pressure returned to baseline and stable Postop Assessment: no apparent nausea or vomiting Anesthetic complications: no   No notable events documented.   Last Vitals:  Vitals:   06/05/21 1712 06/05/21 1715  BP: 134/89 134/89  Pulse:  62  Resp:  15  Temp: 36.7 C   SpO2: 95% 94%    Last Pain:  Vitals:   06/05/21 1712  TempSrc:   PainSc: 3                  Cleda Mccreedy Iretha Kirley

## 2021-06-05 NOTE — Anesthesia Procedure Notes (Signed)
Procedure Name: General with mask airway Date/Time: 06/05/2021 3:02 PM Performed by: Mohammed Kindle, CRNA Pre-anesthesia Checklist: Patient identified, Emergency Drugs available, Suction available and Patient being monitored Patient Re-evaluated:Patient Re-evaluated prior to induction Oxygen Delivery Method: Simple face mask Induction Type: IV induction Placement Confirmation: positive ETCO2 and CO2 detector Dental Injury: Teeth and Oropharynx as per pre-operative assessment

## 2021-06-06 ENCOUNTER — Encounter: Payer: Self-pay | Admitting: Obstetrics and Gynecology

## 2021-06-06 LAB — BPAM RBC
Blood Product Expiration Date: 202210122359
Blood Product Expiration Date: 202210122359
ISSUE DATE / TIME: 202209151519
ISSUE DATE / TIME: 202209151519
Unit Type and Rh: 5100
Unit Type and Rh: 5100

## 2021-06-06 LAB — CBC
HCT: 27 % — ABNORMAL LOW (ref 36.0–46.0)
Hemoglobin: 9.6 g/dL — ABNORMAL LOW (ref 12.0–15.0)
MCH: 32.4 pg (ref 26.0–34.0)
MCHC: 35.6 g/dL (ref 30.0–36.0)
MCV: 91.2 fL (ref 80.0–100.0)
Platelets: 282 10*3/uL (ref 150–400)
RBC: 2.96 MIL/uL — ABNORMAL LOW (ref 3.87–5.11)
RDW: 16.2 % — ABNORMAL HIGH (ref 11.5–15.5)
WBC: 18.8 10*3/uL — ABNORMAL HIGH (ref 4.0–10.5)
nRBC: 0 % (ref 0.0–0.2)

## 2021-06-06 LAB — TYPE AND SCREEN
ABO/RH(D): O POS
Antibody Screen: NEGATIVE
Unit division: 0
Unit division: 0

## 2021-06-06 MED ORDER — LACTATED RINGERS IV SOLN: INTRAVENOUS | Status: DC

## 2021-06-06 MED ORDER — GENTAMICIN SULFATE 40 MG/ML IJ SOLN
5.0000 mg/kg | INTRAVENOUS | Status: DC
  Filled 2021-06-06: qty 8.25

## 2021-06-06 NOTE — Progress Notes (Signed)
Dr. Dalbert Garnet at bedside to removed foley tamponade and foley catheter. Pt tolerated well, although tearful and anxious. Pt stated "I don't want to loose my uterus".  Emotional and physical support given to patient and family members at bedside. Pt ambulated once around nurses station with stand by assist.  Moderate bleeding observed in peripad. QBL 81, 30 minutes after removal of foley bulb.

## 2021-06-06 NOTE — Progress Notes (Addendum)
Report given to RN receiving patient at [facility]. [facility] given report and stated they will be here in an hour pending traffic. Patient updated about plan going forward.

## 2021-06-06 NOTE — Progress Notes (Signed)
[facility] arrived to transfer patient to [facility]. EMTALA and paperwork printed and signed.  Patient transferred out via stretcher.

## 2021-06-06 NOTE — Discharge Summary (Addendum)
1 Day Post-Op       Procedure(s): DILATATION AND CURETTAGE (N/A) EPISIOTOMY REPAIR REVISION Hopsital course: Patient presented to our ER on 9/14 with acute onset of heavy vaginal bleeding 2 weeks after NSVD complicated by stage 2 hemorrhage from vaginal and perineal lacerations, but was discharged in stable condition.  TVUS found retained products of conception with a thickened stripe and she was dx with endometritis based on elevated WBC and exquisite fundal tenderness. She was started on augmentin, which was changed to gent/clinda when it was clear she would remain in house and her cervix was dilated to 3 on exam. After 12 hrs on abx with minimal bleeding, we took her for D&C, with significant bright red bleeding and a total EBL of 600cc. She recevied 1g TXA, 0.2mg  IM methergine, rectal misoprostol and eventually a 30cc foley balloon. Her labs were not suggestive of DIC but her hgb did drop to 6.6 intraop and was 9.6 the morning of transfer after 2 u of pRBCs.   Her 2nd deg laceration on exam under anesthesia was healed below a large web of vicryl suture, and this was revised. Uterine culture was sent.  Her bleeding slowed with the tamponade, which was left in for 18 hours. On removal, her bright red bleeding resumed with 100cc promptly evacuated. At her request, we are transferring her to [facility] for consideration of [place] with IR. Will replace foley if transfer time is long.  Last WBC 18.8   Date of Delivery: May 22, 2021  Delivered By: Roena Malady O'Briant  Attending at Delivery: Rosalita Chessman, MD  Delivery Type: Vaginal, Spontaneous  Infant: Liveborn 3.24 kg (7 lb 2.3 oz)  Information for the patient's newborn: Makyla, Bye Girl Tarissa [[id number]]  female  , Apgars 1 min: 8 / 5 min: 9  Anesthesia: Epidural  Intrapartum complications: Postpartum Hemorrhage Laceration: Perineal: 2nd - repaired / / / / Vaginal: Yes - repaired /  Episiotomy: Episiotomy: None  Placenta: Spontaneous   Neonat for al Course: unremarkable   Objective: Vital signs in last 24 hours: Temp:  [98.1 F (36.7 C)-99.1 F (37.3 C)] 99.1 F (37.3 C) (09/16 1429) Pulse Rate:  [60-81] 62 (09/16 1429) Resp:  [11-20] 18 (09/16 1300) BP: (129-178)/(82-105) 131/88 (09/16 1429) SpO2:  [93 %-100 %] 100 % (09/16 0833)  Intake/Output  Intake/Output Summary (Last 24 hours) at 06/06/2021 1452 Last data filed at 06/06/2021 1423 Gross per 24 hour  Intake 2380 ml  Output 3126 ml  Net -746 ml    Physical Exam:  General: Alert and oriented. CV: RRR Lungs: Clear bilaterally. GI: Soft, Nondistended. Incisions: Clean and dry. Urine: Clear Extremities: Nontender, no erythema, no edema.  Lab Results: Recent Labs    06/05/21 1545 06/05/21 2111 06/06/21 0718  HGB 6.6* 10.3* 9.6*  HCT 19.8* 29.2* 27.0*  WBC 6.9 18.2* 18.8*  PLT 264 270 282                 Results for orders placed or performed during the hospital encounter of 06/04/21 (from the past 24 hour(s))  Aerobic/Anaerobic Culture w Gram Stain (surgical/deep wound)     Status: None (Preliminary result)   Collection Time: 06/05/21  3:05 PM   Specimen: PATH Gyn biopsy; Tissue  Result Value Ref Range   Specimen Description      TISSUE Performed at [facility], [address], [place], [place] [address]    Special Requests      uterine culture Performed at  [facility], [address] [place], [place] [address]    Gram Stain NO WBC SEEN NO ORGANISMS SEEN     Culture      NO GROWTH < 24 HOURS Performed at [facility], [address], [place], [place] [address]    Report Status PENDING   Prepare RBC (crossmatch)     Status: None   Collection Time: 06/05/21  3:12 PM  Result Value Ref Range   Order Confirmation      ORDER PROCESSED BY BLOOD BANK Performed at [facility], [address] [place], [place] [address]   CBC     Status: Abnormal   Collection Time: 06/05/21  3:45 PM  Result  Value Ref Range   WBC 6.9 4.0 - 10.5 K/uL   RBC 2.11 (L) 3.87 - 5.11 MIL/uL   Hemoglobin 6.6 (L) 12.0 - 15.0 g/dL   HCT 18.5 (L) 63.1 - 49.7 %   MCV 93.8 80.0 - 100.0 fL   MCH 31.3 26.0 - 34.0 pg   MCHC 33.3 30.0 - 36.0 g/dL   RDW 02.6 (H) 37.8 - 58.8 %   Platelets 264 150 - 400 K/uL   nRBC 0.0 0.0 - 0.2 %  Protime-INR     Status: None   Collection Time: 06/05/21  3:45 PM  Result Value Ref Range   Prothrombin Time 14.8 11.4 - 15.2 seconds   INR 1.2 0.8 - 1.2  APTT     Status: None   Collection Time: 06/05/21  3:45 PM  Result Value Ref Range   aPTT 32 24 - 36 seconds  Fibrinogen     Status: None   Collection Time: 06/05/21  3:45 PM  Result Value Ref Range   Fibrinogen 226 210 - 475 mg/dL  CBC     Status: Abnormal   Collection Time: 06/05/21  9:11 PM  Result Value Ref Range   WBC 18.2 (H) 4.0 - 10.5 K/uL   RBC 3.15 (L) 3.87 - 5.11 MIL/uL   Hemoglobin 10.3 (L) 12.0 - 15.0 g/dL   HCT 50.2 (L) 77.4 - 12.8 %   MCV 92.7 80.0 - 100.0 fL   MCH 32.7 26.0 - 34.0 pg   MCHC 35.3 30.0 - 36.0 g/dL   RDW 78.6 (H) 76.7 - 20.9 %   Platelets 270 150 - 400 K/uL   nRBC 0.0 0.0 - 0.2 %  CBC     Status: Abnormal   Collection Time: 06/06/21  7:18 AM  Result Value Ref Range   WBC 18.8 (H) 4.0 - 10.5 K/uL   RBC 2.96 (L) 3.87 - 5.11 MIL/uL   Hemoglobin 9.6 (L) 12.0 - 15.0 g/dL   HCT 47.0 (L) 96.2 - 83.6 %   MCV 91.2 80.0 - 100.0 fL   MCH 32.4 26.0 - 34.0 pg   MCHC 35.6 30.0 - 36.0 g/dL   RDW 62.9 (H) 47.6 - 54.6 %   Platelets 282 150 - 400 K/uL   nRBC 0.0 0.0 - 0.2 %    Assessment/Plan: 1 Day Post-Op       Procedure(s): DILATATION AND CURETTAGE (N/A) EPISIOTOMY REPAIR REVISION  Continued postpartum hemorrhage in setting of endometritis without hx of bleeding disorders but obstetric hx significant for PPROM and neonatal loss at 22 wks last year. DDx includes sub-involution of the placental bed, AVM, in setting of infection.   Currently stable. Pt in agreement.    Christeen Douglas,  MD   LOS: 2 days  Christeen Douglas 06/06/2021, 2:52 PM

## 2021-06-06 NOTE — Progress Notes (Signed)
Pt bleeding stable

## 2021-06-09 LAB — SURGICAL PATHOLOGY

## 2021-06-10 LAB — AEROBIC/ANAEROBIC CULTURE W GRAM STAIN (SURGICAL/DEEP WOUND)
Culture: NO GROWTH
Gram Stain: NONE SEEN

## 2021-11-05 ENCOUNTER — Encounter: Payer: Self-pay | Admitting: Internal Medicine

## 2021-11-05 ENCOUNTER — Ambulatory Visit (INDEPENDENT_AMBULATORY_CARE_PROVIDER_SITE_OTHER): Payer: Medicaid Other | Admitting: Internal Medicine

## 2021-11-05 ENCOUNTER — Other Ambulatory Visit: Payer: Self-pay

## 2021-11-05 VITALS — BP 104/74 | HR 89 | Temp 98.1°F | Ht 66.0 in | Wt 118.0 lb

## 2021-11-05 DIAGNOSIS — H6983 Other specified disorders of Eustachian tube, bilateral: Secondary | ICD-10-CM | POA: Diagnosis not present

## 2021-11-05 MED ORDER — FLUTICASONE PROPIONATE 50 MCG/ACT NA SUSP: 2.0000 | Freq: Every day | NASAL | 6 refills | Status: AC

## 2021-11-05 NOTE — Patient Instructions (Signed)
Claritin 10 mg once a day. 

## 2021-11-05 NOTE — Progress Notes (Signed)
Date:  11/05/2021   Name:  Helen Schneider   DOB:  November 15, 1990   MRN:  [id number]   Chief Complaint: Ear Pain  Otalgia  There is pain in the right ear. This is a new problem. The current episode started in the past 7 days. The problem occurs every few minutes. The problem has been waxing and waning. There has been no fever. The pain is at a severity of 7/10. Associated symptoms include a sore throat. Pertinent negatives include no coughing, headaches or neck pain. Covid test at home was Negative per patient. She is currently breastfeeding her 6 mo old daughter.  Lab Results  Component Value Date   NA 139 06/04/2021   K 4.1 06/04/2021   CO2 23 06/04/2021   GLUCOSE 110 (H) 06/04/2021   BUN 14 06/04/2021   CREATININE 0.90 06/04/2021   CALCIUM 9.1 06/04/2021   GFRNONAA >60 06/04/2021   No results found for: CHOL, HDL, LDLCALC, LDLDIRECT, TRIG, CHOLHDL No results found for: TSH No results found for: HGBA1C Lab Results  Component Value Date   WBC 18.8 (H) 06/06/2021   HGB 9.6 (L) 06/06/2021   HCT 27.0 (L) 06/06/2021   MCV 91.2 06/06/2021   PLT 282 06/06/2021   Lab Results  Component Value Date   ALT 9 05/29/2020   AST 17 05/29/2020   ALKPHOS 53 05/29/2020   BILITOT 0.9 05/29/2020   No results found for: 25OHVITD2, 25OHVITD3, VD25OH   Review of Systems  Constitutional:  Negative for chills, fatigue and fever.  HENT:  Positive for ear pain, postnasal drip and sore throat. Negative for sinus pressure.   Respiratory:  Negative for cough, shortness of breath and wheezing.   Cardiovascular:  Negative for chest pain.  Musculoskeletal:  Negative for neck pain.  Neurological:  Negative for dizziness and headaches.   Patient Active Problem List   Diagnosis Date Noted   Delayed postpartum hemorrhage 06/04/2021   Preterm premature rupture of membranes (PPROM) with onset of labor within 24 hours of rupture in second trimester, antepartum 01/24/2020   Sickle cell trait (HCC)  11/08/2019   Anxiety and depression 05/16/2018    Allergies  Allergen Reactions   Kiwi Extract    Cherry Hives   Mushroom Extract Complex Hives    Past Surgical History:  Procedure Laterality Date   DILATION AND CURETTAGE OF UTERUS  01/27/2020   for removal of placenta   DILATION AND CURETTAGE OF UTERUS N/A 06/05/2021   Procedure: DILATATION AND CURETTAGE;  Surgeon: Christeen Douglas, MD;  Location: [facility] ORS;  Service: Gynecology;  Laterality: N/A;   PERINEUM REPAIR  06/05/2021   Procedure: EPISIOTOMY REPAIR REVISION;  Surgeon: Christeen Douglas, MD;  Location: [facility] ORS;  Service: Gynecology;;   WISDOM TOOTH EXTRACTION  2013    Social History   Tobacco Use   Smoking status: Never   Smokeless tobacco: Never  Vaping Use   Vaping Use: Never used  Substance Use Topics   Alcohol use: Never   Drug use: Never     Medication list has been reviewed and updated.  Current Meds  Medication Sig   norethindrone (MICRONOR) 0.35 MG tablet Take 1 tablet by mouth daily.    PHQ 2/9 Scores 11/05/2021 07/25/2020 02/27/2020 07/06/2019  PHQ - 2 Score 2 0 2 0  PHQ- 9 Score 6 0 8 0    GAD 7 : Generalized Anxiety Score 11/05/2021 07/25/2020  Nervous, Anxious, on Edge 1 0  Control/stop worrying 1 0  Worry  too much - different things 1 0  Trouble relaxing 1 0  Restless 0 0  Easily annoyed or irritable 1 0  Afraid - awful might happen 1 0  Total GAD 7 Score 6 0  Anxiety Difficulty Not difficult at all Not difficult at all    BP Readings from Last 3 Encounters:  11/05/21 104/74  06/06/21 113/75  08/29/20 109/78    Physical Exam Vitals and nursing note reviewed.  Constitutional:      General: She is not in acute distress.    Appearance: Normal appearance. She is well-developed.  HENT:     Head: Normocephalic and atraumatic.     Right Ear: Ear canal normal. No middle ear effusion. Tympanic membrane is retracted. Tympanic membrane is not erythematous.     Left Ear: Ear canal normal.  No  middle ear effusion. Tympanic membrane is retracted. Tympanic membrane is not erythematous.     Nose:     Right Sinus: No maxillary sinus tenderness or frontal sinus tenderness.     Left Sinus: No maxillary sinus tenderness or frontal sinus tenderness.     Mouth/Throat:     Pharynx: Posterior oropharyngeal erythema present. No oropharyngeal exudate or uvula swelling.  Neck:     Vascular: No carotid bruit.  Cardiovascular:     Rate and Rhythm: Normal rate and regular rhythm.     Pulses: Normal pulses.  Pulmonary:     Effort: Pulmonary effort is normal. No respiratory distress.     Breath sounds: No wheezing or rhonchi.  Musculoskeletal:     Cervical back: Normal range of motion.  Lymphadenopathy:     Cervical: No cervical adenopathy.  Skin:    General: Skin is warm and dry.     Findings: No rash.  Neurological:     Mental Status: She is alert and oriented to person, place, and time.  Psychiatric:        Mood and Affect: Mood normal.        Behavior: Behavior normal.    Wt Readings from Last 3 Encounters:  11/05/21 118 lb (53.5 kg)  06/04/21 145 lb (65.8 kg)  07/25/20 125 lb (56.7 kg)    BP 104/74    Pulse 89    Temp 98.1 F (36.7 C) (Oral)    Ht 5\' 6"  (1.676 m)    Wt 118 lb (53.5 kg)    LMP  (LMP Unknown)    SpO2 99%    Breastfeeding Yes    BMI 19.05 kg/m   Assessment and Plan: 1. Eustachian tube dysfunction, bilateral Likely due to seasonal allergies. Recommend Claritin 10 mg once a day and Flonase NS daily as long as sx continue No evidence of bacterial infection - fluticasone (FLONASE) 50 MCG/ACT nasal spray; Place 2 sprays into both nostrils daily.  Dispense: 16 g; Refill: 6   Partially dictated using . Any errors are unintentional.  Animal nutritionist, MD [facility]  11/05/2021

## 2021-11-09 IMAGING — US US PELVIS COMPLETE
1 series · 14 of 25 positions shown · non-contrast
Comparison: None.

CLINICAL DATA: Vaginal bleeding. Vaginal delivery on 05/22/2021
complicated by postpartum hemorrhage. Large clots passing through.
Cramps.

EXAM:
TRANSABDOMINAL ULTRASOUND OF PELVIS
TECHNIQUE: Transabdominal ultrasound examination of the pelvis was performed
including evaluation of the uterus, ovaries, adnexal regions, and
pelvic cul-de-sac.

[Series 1: us pelvis (transabdominal only) · 14 of 51 slices shown]
[im 1/51]
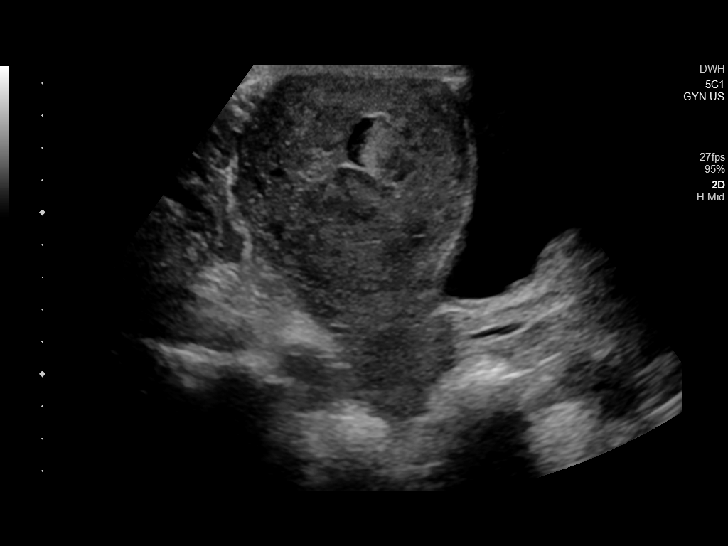
[im 5/51]
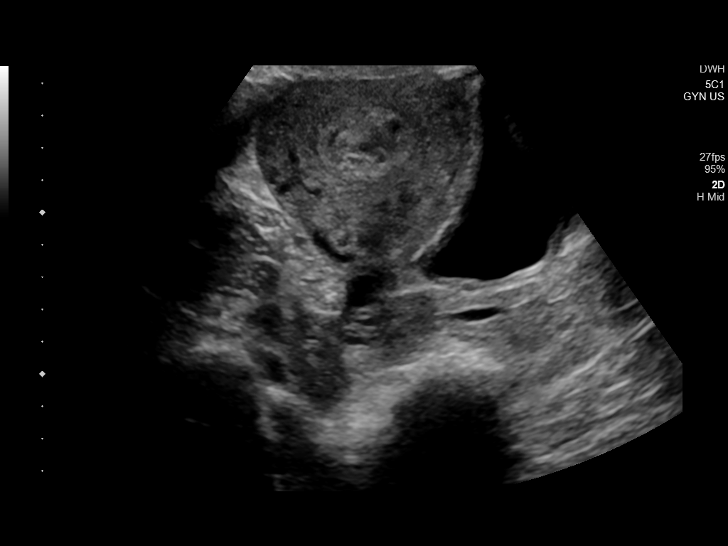
[im 9/51]
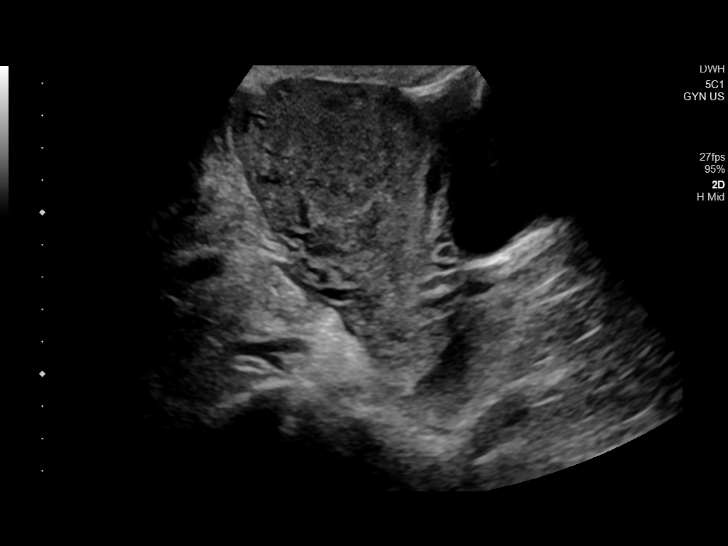
[im 13/51]
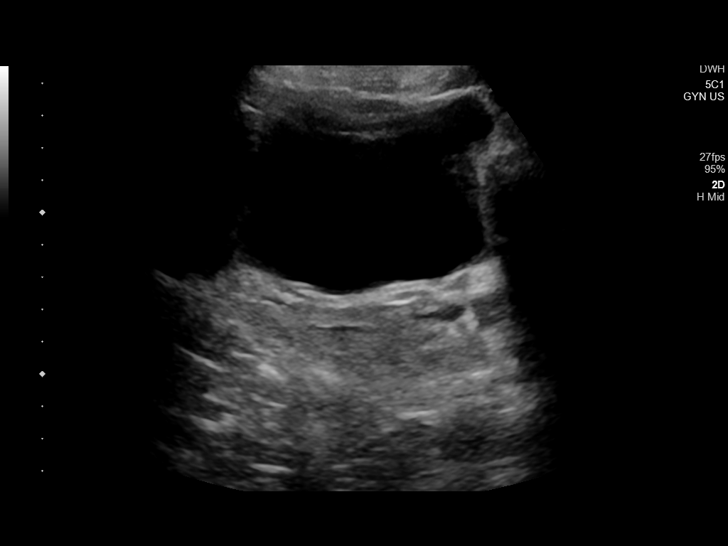
[im 17/51]
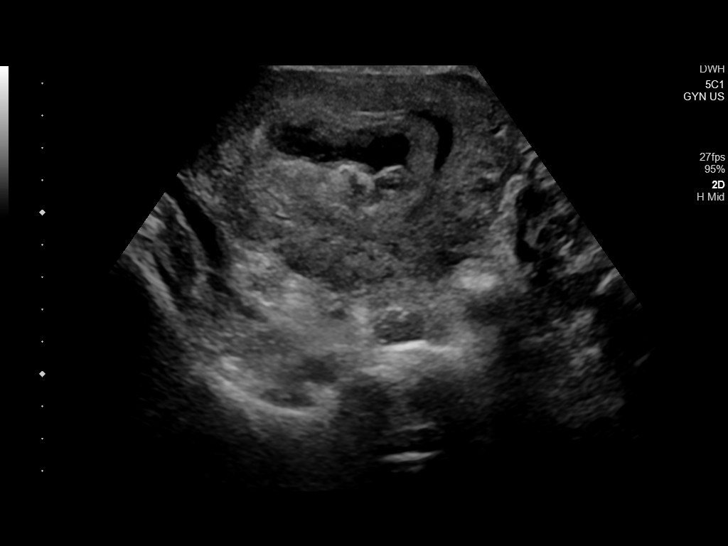
[im 19/51]
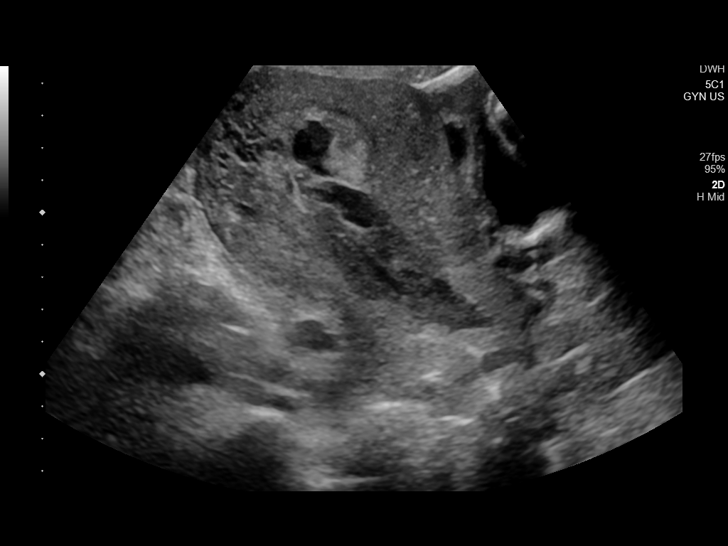
[im 23/51]
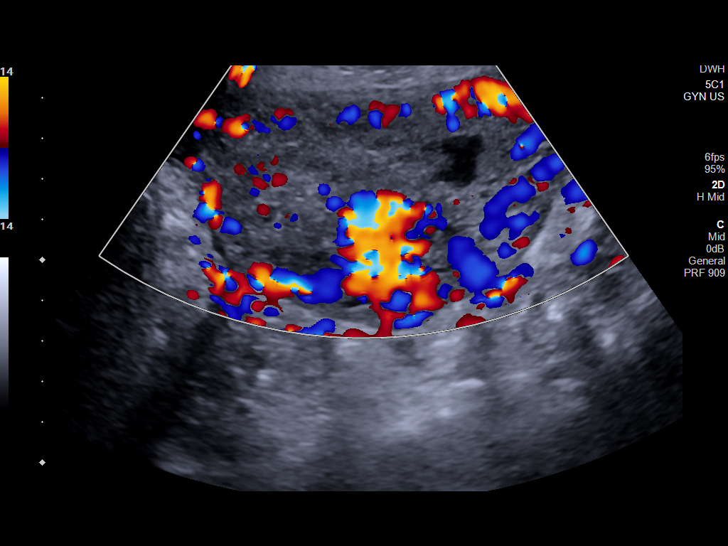
[im 28/51]
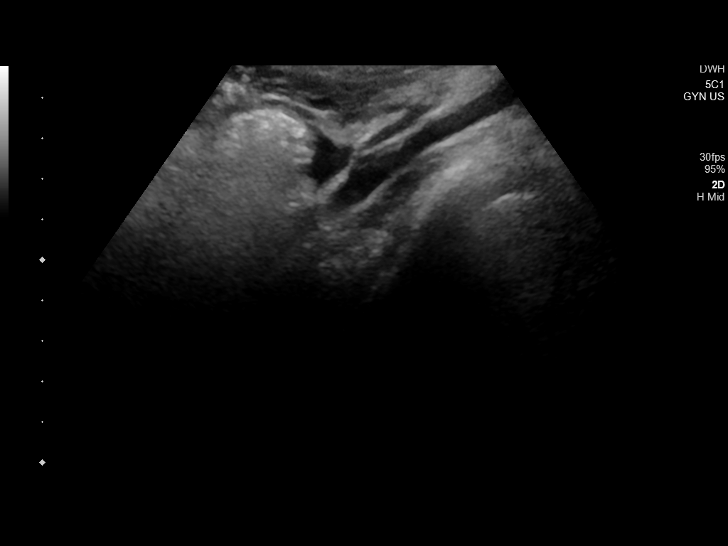
[im 32/51]
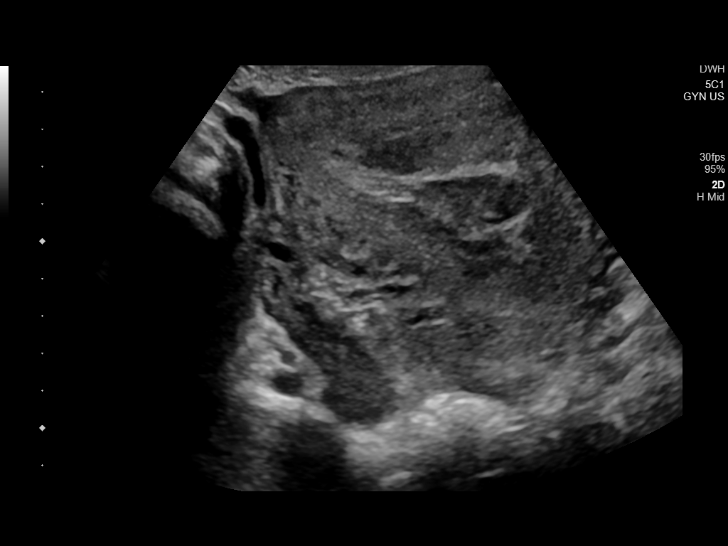
[im 34/51]
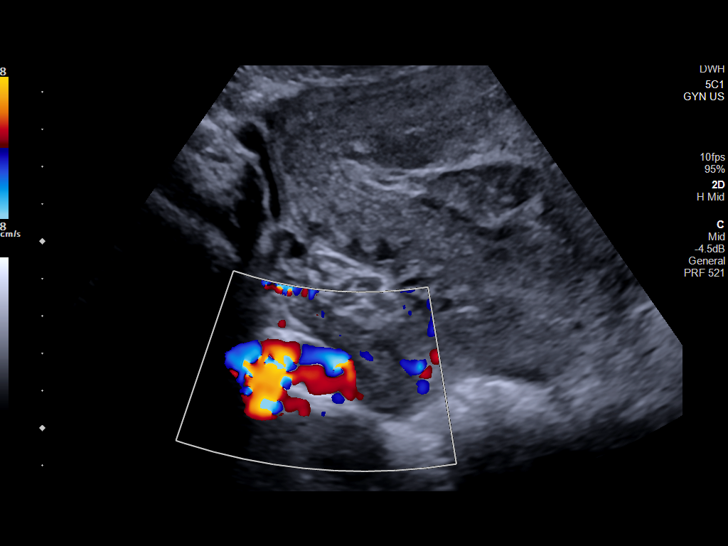
[im 38/51]
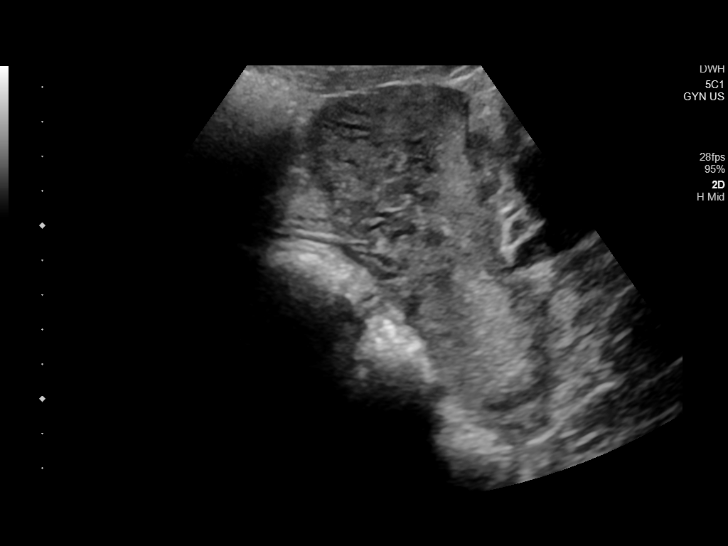
[im 42/51]
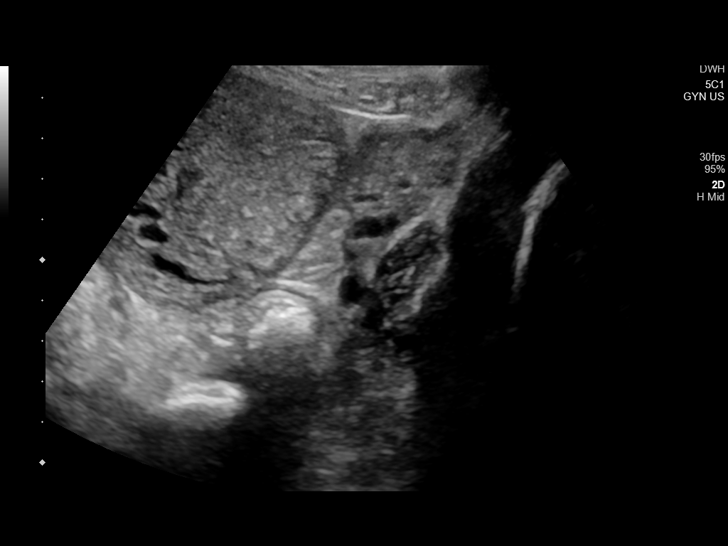
[im 46/51]
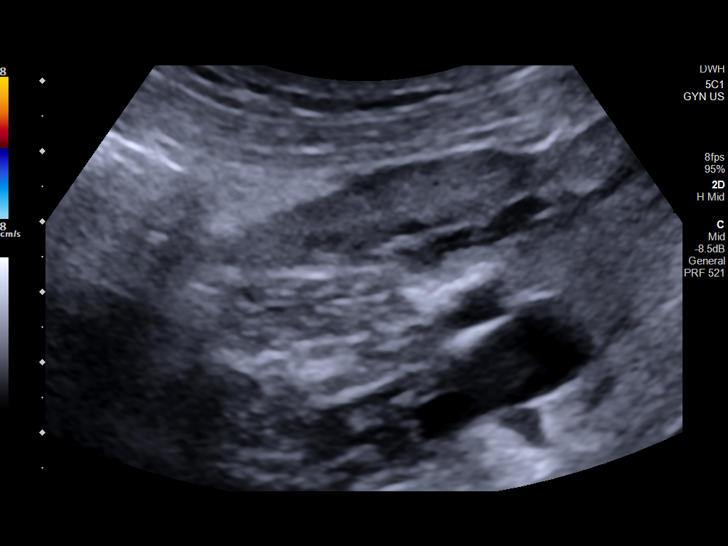
[im 51/51]
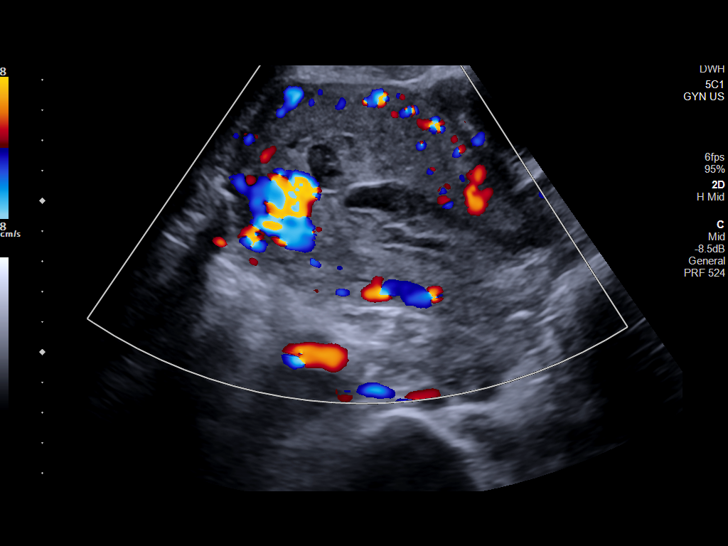

[14 of 25 positions shown; findings below may reference images not displayed]

FINDINGS: Uterus

Measurements: 10.8 x 7.4 x 10.6 cm = volume: 441 mL. Enlarged
postpartum uterus with increased vascularity throughout the
myometrium. No fibroids or other mass visualized.

Endometrium

Thickness: 34 mm. Thickened and heterogeneous with increased
vascularity.

Right ovary

Measurements: 5.1 x 1.2 x 2.2 cm = volume: 9 mL. Normal
appearance/no adnexal mass.

Left ovary

Measurements: 3.5 x 1 x 2.2 cm = volume: 4 mL. Normal appearance/no
adnexal mass.

Other findings:  No abnormal free fluid.
IMPRESSION: Thickened vascular endometrium (34 mm) suggestive of retained
products of conception and blood clots. Nonspecific adjacent
increased vascularity of the myometrium.

## 2022-02-10 ENCOUNTER — Ambulatory Visit: Payer: Medicaid Other | Admitting: Internal Medicine

## 2022-02-25 ENCOUNTER — Ambulatory Visit (INDEPENDENT_AMBULATORY_CARE_PROVIDER_SITE_OTHER): Payer: Medicaid Other | Admitting: Internal Medicine

## 2022-02-25 ENCOUNTER — Encounter: Payer: Self-pay | Admitting: Internal Medicine

## 2022-02-25 VITALS — BP 118/64 | HR 90 | Ht 66.0 in | Wt 107.4 lb

## 2022-02-25 DIAGNOSIS — R42 Dizziness and giddiness: Secondary | ICD-10-CM | POA: Diagnosis not present

## 2022-02-25 DIAGNOSIS — R110 Nausea: Secondary | ICD-10-CM

## 2022-02-25 DIAGNOSIS — M62838 Other muscle spasm: Secondary | ICD-10-CM

## 2022-02-25 DIAGNOSIS — R634 Abnormal weight loss: Secondary | ICD-10-CM

## 2022-02-25 DIAGNOSIS — F53 Postpartum depression: Secondary | ICD-10-CM

## 2022-02-25 MED ORDER — ONDANSETRON HCL 4 MG PO TABS: 4.0000 mg | ORAL_TABLET | Freq: Three times a day (TID) | ORAL | 0 refills | Status: AC | PRN

## 2022-02-25 MED ORDER — METHOCARBAMOL 500 MG PO TABS: 500.0000 mg | ORAL_TABLET | Freq: Every day | ORAL | 0 refills | Status: DC

## 2022-02-25 NOTE — Progress Notes (Signed)
Date:  02/25/2022   Name:  Helen Schneider   DOB:  04/11/1991   MRN:  [id number]   Chief Complaint: Neck Pain and Dizziness  Neck Pain  This is a new problem. The current episode started more than 1 month ago. The problem occurs constantly. The problem has been unchanged. The pain is present in the midline. The symptoms are aggravated by position and twisting (palpation). Associated symptoms include headaches and a visual change. Pertinent negatives include no chest pain, fever, numbness, pain with swallowing, photophobia, syncope or trouble swallowing. Associated symptoms comments: Seems to trigger vertigo.  Dizziness This is a new problem. The current episode started more than 1 month ago. The problem occurs intermittently. Associated symptoms include headaches, nausea, neck pain and a visual change. Pertinent negatives include no chest pain, chills, fatigue, fever, numbness or vomiting.  Depression        This is a new problem.  The current episode started more than 1 month ago (postpartum).   The problem occurs daily.The problem is unchanged.  Associated symptoms include headaches.  Associated symptoms include no fatigue.  Past treatments include psychotherapy.  Compliance with treatment is good.  Previous treatment provided no relief relief. Nausea and weight loss - she had a gi virus or food poisoning about three months ago.  Since then is nauseated daily without vomiting.  Her appetite is poor but she has tried to eat home cooked meals.  She has lost weight as a result of limited intake.  She has not tried supplements or protein shakes.  She is breast feeding her 34 month old.  Lab Results  Component Value Date   NA 139 06/04/2021   K 4.1 06/04/2021   CO2 23 06/04/2021   GLUCOSE 110 (H) 06/04/2021   BUN 14 06/04/2021   CREATININE 0.90 06/04/2021   CALCIUM 9.1 06/04/2021   GFRNONAA >60 06/04/2021   No results found for: CHOL, HDL, LDLCALC, LDLDIRECT, TRIG, CHOLHDL No results found for:  TSH No results found for: HGBA1C Lab Results  Component Value Date   WBC 18.8 (H) 06/06/2021   HGB 9.6 (L) 06/06/2021   HCT 27.0 (L) 06/06/2021   MCV 91.2 06/06/2021   PLT 282 06/06/2021   Lab Results  Component Value Date   ALT 9 05/29/2020   AST 17 05/29/2020   ALKPHOS 53 05/29/2020   BILITOT 0.9 05/29/2020   No results found for: 25OHVITD2, 25OHVITD3, VD25OH   Review of Systems  Constitutional:  Positive for unexpected weight change. Negative for chills, fatigue and fever.  HENT:  Negative for trouble swallowing.   Eyes:  Negative for photophobia.  Respiratory:  Negative for chest tightness and shortness of breath.   Cardiovascular:  Negative for chest pain and syncope.  Gastrointestinal:  Positive for nausea. Negative for constipation, diarrhea and vomiting.  Musculoskeletal:  Positive for neck pain.  Neurological:  Positive for dizziness and headaches. Negative for numbness.  Psychiatric/Behavioral:  Positive for depression and dysphoric mood. Negative for sleep disturbance. The patient is nervous/anxious.    Patient Active Problem List   Diagnosis Date Noted   Delayed postpartum hemorrhage 06/04/2021   Preterm premature rupture of membranes (PPROM) with onset of labor within 24 hours of rupture in second trimester, antepartum 01/24/2020   Sickle cell trait (HCC) 11/08/2019   Anxiety and depression 05/16/2018    Allergies  Allergen Reactions   Kiwi Extract    Cherry Hives   Mushroom Extract Complex Hives    Past Surgical  History:  Procedure Laterality Date   DILATION AND CURETTAGE OF UTERUS  01/27/2020   for removal of placenta   DILATION AND CURETTAGE OF UTERUS N/A 06/05/2021   Procedure: DILATATION AND CURETTAGE;  Surgeon: Christeen Douglas, MD;  Location: [facility] OR[facility];  Service: Gynecology;  Laterality: N/A;   PERINEUM REPAIR  06/05/2021   Procedure: EPISIOTOMY REPAIR REVISION;  Surgeon: Christeen Douglas, MD;  Location: [facility];  Service: Gynecology;;   WISDOM  TOOTH EXTRACTION  2013    Social History   Tobacco Use   Smoking status: Never   Smokeless tobacco: Never  Vaping Use   Vaping Use: Never used  Substance Use Topics   Alcohol use: Never   Drug use: Never     Medication list has been reviewed and updated.  Current Meds  Medication Sig   fluticasone (FLONASE) 50 MCG/ACT nasal spray Place 2 sprays into both nostrils daily.   norethindrone (MICRONOR) 0.35 MG tablet Take 1 tablet by mouth daily.       02/25/2022    2:56 PM 11/05/2021   10:52 AM 07/25/2020    8:50 AM  GAD 7 : Generalized Anxiety Score  Nervous, Anxious, on Edge 2 1 0  Control/stop worrying 2 1 0  Worry too much - different things 2 1 0  Trouble relaxing 2 1 0  Restless 2 0 0  Easily annoyed or irritable 2 1 0  Afraid - awful might happen 2 1 0  Total GAD 7 Score 14 6 0  Anxiety Difficulty Very difficult Not difficult at all Not difficult at all       02/25/2022    2:56 PM  Depression screen PHQ 2/9  Decreased Interest 1  Down, Depressed, Hopeless 2  PHQ - 2 Score 3  Altered sleeping 1  Tired, decreased energy 1  Change in appetite 3  Feeling bad or failure about yourself  1  Trouble concentrating 3  Moving slowly or fidgety/restless 1  Suicidal thoughts 0  PHQ-9 Score 13  Difficult doing work/chores Somewhat difficult    BP Readings from Last 3 Encounters:  02/25/22 118/64  11/05/21 104/74  06/06/21 113/75    Physical Exam Constitutional:      General: She is not in acute distress. HENT:     Right Ear: Hearing, tympanic membrane and ear canal normal.     Left Ear: Hearing, tympanic membrane and ear canal normal.     Nose:     Right Sinus: No maxillary sinus tenderness.     Left Sinus: No maxillary sinus tenderness.  Eyes:     General: Lids are normal.     Extraocular Movements:     Right eye: Nystagmus present.     Left eye: Nystagmus present.  Cardiovascular:     Rate and Rhythm: Normal rate and regular rhythm.     Pulses: Normal  pulses.     Heart sounds: No murmur heard. Pulmonary:     Effort: Pulmonary effort is normal.     Breath sounds: No wheezing or rhonchi.  Abdominal:     General: Abdomen is flat.     Palpations: Abdomen is soft.  Musculoskeletal:     Cervical back: Spasms and tenderness present. No bony tenderness. Pain with movement present. Decreased range of motion.     Right lower leg: No edema.     Left lower leg: No edema.  Neurological:     Mental Status: She is alert.    Wt Readings from Last 3  Encounters:  02/25/22 107 lb 6.4 oz (48.7 kg)  11/05/21 118 lb (53.5 kg)  06/04/21 145 lb (65.8 kg)    BP 118/64   Pulse 90   Ht 5\' 6"  (1.676 m)   Wt 107 lb 6.4 oz (48.7 kg)   SpO2 97%   BMI 17.33 kg/m   Assessment and Plan: 1. Vertigo Ongoing symptoms are mild and intermittent No obvious cause on exam Hesitate to give Meclizine due to sedation - will treat neck pain and monitor  - CBC with Differential/Platelet  2. Neck muscle spasm Likely muscular - unclear the role this is playing in her dizziness. Recommend heat off and on through the day and Robaxin at bedtime - methocarbamol (ROBAXIN) 500 MG tablet; Take 1 tablet (500 mg total) by mouth at bedtime.  Dispense: 30 tablet; Refill: 0  3. Nausea in adult Following gi virus - unclear why this continues Exam is normal other than weight loss. Will try Zofran and encourage bland diet, protein shakes and possible Ensure Labs ordered - ondansetron (ZOFRAN) 4 MG tablet; Take 1 tablet (4 mg total) by mouth every 8 (eight) hours as needed for nausea or vomiting.  Dispense: 20 tablet; Refill: 0  4. Weight loss, abnormal As above - Comprehensive metabolic panel - TSH  5. Postpartum depression Continue counseling. Consider Psych referral if no improvement Nausea and decreased intake may be related to depression - will await labs   Partially dictated using Dragon software. Any errors are unintentional.  Bari EdwardLaura Sevastian Witczak, MD [facility]  02/25/2022

## 2022-02-26 LAB — COMPREHENSIVE METABOLIC PANEL
ALT: 14 IU/L (ref 0–32)
AST: 20 IU/L (ref 0–40)
Albumin/Globulin Ratio: 1.7 (ref 1.2–2.2)
Albumin: 5.4 g/dL — ABNORMAL HIGH (ref 3.8–4.8)
Alkaline Phosphatase: 80 IU/L (ref 44–121)
BUN/Creatinine Ratio: 15 (ref 9–23)
BUN: 14 mg/dL (ref 6–20)
Bilirubin Total: 1.6 mg/dL — ABNORMAL HIGH (ref 0.0–1.2)
CO2: 24 mmol/L (ref 20–29)
Calcium: 10.5 mg/dL — ABNORMAL HIGH (ref 8.7–10.2)
Chloride: 99 mmol/L (ref 96–106)
Creatinine, Ser: 0.95 mg/dL (ref 0.57–1.00)
Globulin, Total: 3.1 g/dL (ref 1.5–4.5)
Glucose: 72 mg/dL (ref 70–99)
Potassium: 4.4 mmol/L (ref 3.5–5.2)
Sodium: 138 mmol/L (ref 134–144)
Total Protein: 8.5 g/dL (ref 6.0–8.5)
eGFR: 82 mL/min/{1.73_m2} (ref >59)

## 2022-02-26 LAB — CBC WITH DIFFERENTIAL/PLATELET
Basophils Absolute: 0 10*3/uL (ref 0.0–0.2)
Basos: 1 %
EOS (ABSOLUTE): 0.1 10*3/uL (ref 0.0–0.4)
Eos: 1 %
Hematocrit: 38.9 % (ref 34.0–46.6)
Hemoglobin: 13.1 g/dL (ref 11.1–15.9)
Immature Grans (Abs): 0 10*3/uL (ref 0.0–0.1)
Immature Granulocytes: 0 %
Lymphocytes Absolute: 2.1 10*3/uL (ref 0.7–3.1)
Lymphs: 26 %
MCH: 30.8 pg (ref 26.6–33.0)
MCHC: 33.7 g/dL (ref 31.5–35.7)
MCV: 91 fL (ref 79–97)
Monocytes Absolute: 0.9 10*3/uL (ref 0.1–0.9)
Monocytes: 11 %
Neutrophils Absolute: 4.9 10*3/uL (ref 1.4–7.0)
Neutrophils: 61 %
Platelets: 322 10*3/uL (ref 150–450)
RBC: 4.26 x10E6/uL (ref 3.77–5.28)
RDW: 12.9 % (ref 11.7–15.4)
WBC: 8 10*3/uL (ref 3.4–10.8)

## 2022-02-26 LAB — TSH: TSH: 2.57 u[IU]/mL (ref 0.450–4.500)

## 2022-04-03 ENCOUNTER — Other Ambulatory Visit: Payer: Self-pay | Admitting: Internal Medicine

## 2022-04-03 DIAGNOSIS — M62838 Other muscle spasm: Secondary | ICD-10-CM

## 2022-04-03 NOTE — Telephone Encounter (Signed)
Requested medication (s) are due for refill today: yes  Requested medication (s) are on the active medication list: yes  Last refill:  02/25/22 #30/0  Future visit scheduled: yes  Notes to clinic:  Unable to refill per protocol, cannot delegate.      Requested Prescriptions  Pending Prescriptions Disp Refills   methocarbamol (ROBAXIN) 500 MG tablet [Pharmacy Med Name: METHOCARBAMOL 500 MG TABLET] 30 tablet 0    Sig: TAKE 1 TABLET BY MOUTH AT BEDTIME.     Not Delegated - Analgesics:  Muscle Relaxants Failed - 04/03/2022  1:34 PM      Failed - This refill cannot be delegated      Passed - Valid encounter within last 6 months    Recent Outpatient Visits           1 month ago Vertigo   [facility] Reubin Milan, MD   4 months ago Eustachian tube dysfunction, bilateral   [facility] Reubin Milan, MD   1 year ago BV (bacterial vaginosis)   [facility] Reubin Milan, MD   2 years ago Amenorrhea   [facility] Reubin Milan, MD   2 years ago Acute non-recurrent sinusitis, unspecified location   [facility] Reubin Milan, MD       Future Appointments             In 3 days Reubin Milan, MD [facility]

## 2022-04-06 ENCOUNTER — Encounter: Payer: Self-pay | Admitting: Internal Medicine

## 2022-04-06 ENCOUNTER — Ambulatory Visit (INDEPENDENT_AMBULATORY_CARE_PROVIDER_SITE_OTHER): Payer: Medicaid Other | Admitting: Internal Medicine

## 2022-04-06 VITALS — BP 102/78 | HR 80 | Ht 66.0 in | Wt 108.0 lb

## 2022-04-06 DIAGNOSIS — N760 Acute vaginitis: Secondary | ICD-10-CM

## 2022-04-06 DIAGNOSIS — B9689 Other specified bacterial agents as the cause of diseases classified elsewhere: Secondary | ICD-10-CM

## 2022-04-06 MED ORDER — METRONIDAZOLE 500 MG PO TABS: 500.0000 mg | ORAL_TABLET | Freq: Three times a day (TID) | ORAL | 0 refills | Status: DC

## 2022-04-06 NOTE — Progress Notes (Signed)
Date:  04/06/2022   Name:  Helen Schneider   DOB:  July 19, 1991   MRN:  [id number]   Chief Complaint: Vaginal Discharge  Vaginal Discharge The patient's primary symptoms include vaginal discharge. The patient's pertinent negatives include no pelvic pain. This is a new problem. The current episode started yesterday. The problem has been unchanged. The patient is experiencing no pain. Pertinent negatives include no abdominal pain, back pain, chills or fever. The vaginal discharge was yellow and thin. There has been no bleeding. She has tried nothing for the symptoms.    Lab Results  Component Value Date   NA 138 02/25/2022   K 4.4 02/25/2022   CO2 24 02/25/2022   GLUCOSE 72 02/25/2022   BUN 14 02/25/2022   CREATININE 0.95 02/25/2022   CALCIUM 10.5 (H) 02/25/2022   EGFR 82 02/25/2022   GFRNONAA >60 06/04/2021   No results found for: "CHOL", "HDL", "LDLCALC", "LDLDIRECT", "TRIG", "CHOLHDL" Lab Results  Component Value Date   TSH 2.570 02/25/2022   No results found for: "HGBA1C" Lab Results  Component Value Date   WBC 8.0 02/25/2022   HGB 13.1 02/25/2022   HCT 38.9 02/25/2022   MCV 91 02/25/2022   PLT 322 02/25/2022   Lab Results  Component Value Date   ALT 14 02/25/2022   AST 20 02/25/2022   ALKPHOS 80 02/25/2022   BILITOT 1.6 (H) 02/25/2022   No results found for: "25OHVITD2", "25OHVITD3", "VD25OH"   Review of Systems  Constitutional:  Negative for chills, fatigue and fever.  Cardiovascular:  Negative for chest pain.  Gastrointestinal:  Negative for abdominal pain.  Genitourinary:  Positive for vaginal discharge. Negative for genital sores and pelvic pain.  Musculoskeletal:  Negative for back pain.    Patient Active Problem List   Diagnosis Date Noted   Delayed postpartum hemorrhage 06/04/2021   Preterm premature rupture of membranes (PPROM) with onset of labor within 24 hours of rupture in second trimester, antepartum 01/24/2020   Sickle cell trait ([place])  11/08/2019   Anxiety and depression 05/16/2018    Allergies  Allergen Reactions   Kiwi Extract    Cherry Hives   Mushroom Extract Complex Hives    Past Surgical History:  Procedure Laterality Date   DILATION AND CURETTAGE OF UTERUS  01/27/2020   for removal of placenta   DILATION AND CURETTAGE OF UTERUS N/A 06/05/2021   Procedure: DILATATION AND CURETTAGE;  Surgeon: Benjaman Kindler, MD;  Location: [facility] ORS;  Service: Gynecology;  Laterality: N/A;   PERINEUM REPAIR  06/05/2021   Procedure: EPISIOTOMY REPAIR REVISION;  Surgeon: Benjaman Kindler, MD;  Location: [facility] ORS;  Service: Gynecology;;   WISDOM TOOTH EXTRACTION  2013    Social History   Tobacco Use   Smoking status: Never   Smokeless tobacco: Never  Vaping Use   Vaping Use: Never used  Substance Use Topics   Alcohol use: Never   Drug use: Never     Medication list has been reviewed and updated.  Current Meds  Medication Sig   fluticasone (FLONASE) 50 MCG/ACT nasal spray Place 2 sprays into both nostrils daily.   methocarbamol (ROBAXIN) 500 MG tablet TAKE 1 TABLET BY MOUTH AT BEDTIME.   metroNIDAZOLE (FLAGYL) 500 MG tablet Take 1 tablet (500 mg total) by mouth 3 (three) times daily for 7 days.   norethindrone (MICRONOR) 0.35 MG tablet Take 1 tablet by mouth daily.   ondansetron (ZOFRAN) 4 MG tablet Take 1 tablet (4 mg total) by mouth every  8 (eight) hours as needed for nausea or vomiting.       02/25/2022    2:56 PM 11/05/2021   10:52 AM 07/25/2020    8:50 AM  GAD 7 : Generalized Anxiety Score  Nervous, Anxious, on Edge 2 1 0  Control/stop worrying 2 1 0  Worry too much - different things 2 1 0  Trouble relaxing 2 1 0  Restless 2 0 0  Easily annoyed or irritable 2 1 0  Afraid - awful might happen 2 1 0  Total GAD 7 Score 14 6 0  Anxiety Difficulty Very difficult Not difficult at all Not difficult at all       02/25/2022    2:56 PM 11/05/2021   10:51 AM 07/25/2020    8:49 AM  Depression screen PHQ 2/9   Decreased Interest 1 1 0  Down, Depressed, Hopeless 2 1 0  PHQ - 2 Score 3 2 0  Altered sleeping 1 1 0  Tired, decreased energy 1 0 0  Change in appetite 3 1 0  Feeling bad or failure about yourself  1 1 0  Trouble concentrating 3 0 0  Moving slowly or fidgety/restless 1 0 0  Suicidal thoughts 0 1 0  PHQ-9 Score 13 6 0  Difficult doing work/chores Somewhat difficult Not difficult at all Not difficult at all    BP Readings from Last 3 Encounters:  04/06/22 102/78  02/25/22 118/64  11/05/21 104/74    Physical Exam Vitals and nursing note reviewed.  Constitutional:      General: She is not in acute distress.    Appearance: She is well-developed.  HENT:     Head: Normocephalic and atraumatic.  Pulmonary:     Effort: Pulmonary effort is normal. No respiratory distress.  Abdominal:     General: Abdomen is flat.     Palpations: Abdomen is soft.     Tenderness: There is no abdominal tenderness. There is no right CVA tenderness or left CVA tenderness.  Skin:    General: Skin is warm and dry.     Findings: No rash.  Neurological:     Mental Status: She is alert and oriented to person, place, and time.  Psychiatric:        Mood and Affect: Mood normal.        Behavior: Behavior normal.     Wt Readings from Last 3 Encounters:  04/06/22 108 lb (49 kg)  02/25/22 107 lb 6.4 oz (48.7 kg)  11/05/21 118 lb (53.5 kg)    BP 102/78   Pulse 80   Ht _0  (1.676 m)   Wt 108 lb (49 kg)   BMI 17.43 kg/m   Assessment and Plan: 1. BV (bacterial vaginosis) Will treat presumptively since internet and power were out at time of visit - metroNIDAZOLE (FLAGYL) 500 MG tablet; Take 1 tablet (500 mg total) by mouth 3 (three) times daily for 7 days.  Dispense: 21 tablet; Refill: 0   Partially dictated using Editor, commissioning. Any errors are unintentional.  Halina Maidens, MD [facility]  04/06/2022

## 2022-04-07 ENCOUNTER — Encounter: Payer: Self-pay | Admitting: Internal Medicine

## 2022-04-08 ENCOUNTER — Other Ambulatory Visit: Payer: Self-pay | Admitting: Internal Medicine

## 2022-04-08 DIAGNOSIS — B9689 Other specified bacterial agents as the cause of diseases classified elsewhere: Secondary | ICD-10-CM

## 2022-04-08 MED ORDER — METRONIDAZOLE 0.75 % VA GEL: 1.0000 | Freq: Two times a day (BID) | VAGINAL | 0 refills | Status: DC

## 2022-05-05 ENCOUNTER — Encounter: Payer: Self-pay | Admitting: Internal Medicine

## 2022-05-05 ENCOUNTER — Ambulatory Visit (INDEPENDENT_AMBULATORY_CARE_PROVIDER_SITE_OTHER): Payer: Medicaid Other | Admitting: Internal Medicine

## 2022-05-05 VITALS — BP 102/76 | HR 79 | Ht 66.0 in | Wt 108.0 lb

## 2022-05-05 DIAGNOSIS — R0981 Nasal congestion: Secondary | ICD-10-CM | POA: Diagnosis not present

## 2022-05-05 MED ORDER — AZITHROMYCIN 250 MG PO TABS: ORAL_TABLET | ORAL | 0 refills | Status: DC

## 2022-05-05 NOTE — Progress Notes (Signed)
Date:  05/05/2022   Name:  Helen Schneider   DOB:  20-Jun-1991   MRN:  [id number]   Chief Complaint: Numbness (X 4 days, comes and goes,right side Facial numbness, starts at nose then goes to cheek area, tingling sensation, never happened before, no medications tried, last for 30-45 mins, goes away for 10-15 mins and comes back )  HPI Recent Sinus congestion/URI last week before the symptoms started.  Her right upper and lower lip have decreased sensation along with the right cheek.   No dental issues, no nasal obstruction.  Tingling comes and goes and is unrelated to any specific activity or position.  Lab Results  Component Value Date   NA 138 02/25/2022   K 4.4 02/25/2022   CO2 24 02/25/2022   GLUCOSE 72 02/25/2022   BUN 14 02/25/2022   CREATININE 0.95 02/25/2022   CALCIUM 10.5 (H) 02/25/2022   EGFR 82 02/25/2022   GFRNONAA >60 06/04/2021   No results found for: "CHOL", "HDL", "LDLCALC", "LDLDIRECT", "TRIG", "CHOLHDL" Lab Results  Component Value Date   TSH 2.570 02/25/2022   No results found for: "HGBA1C" Lab Results  Component Value Date   WBC 8.0 02/25/2022   HGB 13.1 02/25/2022   HCT 38.9 02/25/2022   MCV 91 02/25/2022   PLT 322 02/25/2022   Lab Results  Component Value Date   ALT 14 02/25/2022   AST 20 02/25/2022   ALKPHOS 80 02/25/2022   BILITOT 1.6 (H) 02/25/2022   No results found for: "25OHVITD2", "25OHVITD3", "VD25OH"   Review of Systems  Constitutional:  Negative for chills, fatigue and fever.  HENT:  Positive for congestion and sinus pressure. Negative for dental problem, facial swelling, hearing loss, sore throat, tinnitus and trouble swallowing.   Respiratory:  Negative for chest tightness and shortness of breath.   Neurological:  Negative for dizziness, facial asymmetry and speech difficulty.    Patient Active Problem List   Diagnosis Date Noted   Delayed postpartum hemorrhage 06/04/2021   Preterm premature rupture of membranes (PPROM) with onset  of labor within 24 hours of rupture in second trimester, antepartum 01/24/2020   Sickle cell trait ([place]) 11/08/2019   Anxiety and depression 05/16/2018    Allergies  Allergen Reactions   Kiwi Extract    Cherry Hives   Mushroom Extract Complex Hives    Past Surgical History:  Procedure Laterality Date   DILATION AND CURETTAGE OF UTERUS  01/27/2020   for removal of placenta   DILATION AND CURETTAGE OF UTERUS N/A 06/05/2021   Procedure: DILATATION AND CURETTAGE;  Surgeon: Benjaman Kindler, MD;  Location: [facility] ORS;  Service: Gynecology;  Laterality: N/A;   PERINEUM REPAIR  06/05/2021   Procedure: EPISIOTOMY REPAIR REVISION;  Surgeon: Benjaman Kindler, MD;  Location: [facility] ORS;  Service: Gynecology;;   WISDOM TOOTH EXTRACTION  2013    Social History   Tobacco Use   Smoking status: Never   Smokeless tobacco: Never  Vaping Use   Vaping Use: Never used  Substance Use Topics   Alcohol use: Never   Drug use: Never     Medication list has been reviewed and updated.  Current Meds  Medication Sig   fluticasone (FLONASE) 50 MCG/ACT nasal spray Place 2 sprays into both nostrils daily.   methocarbamol (ROBAXIN) 500 MG tablet TAKE 1 TABLET BY MOUTH AT BEDTIME.   metroNIDAZOLE (METROGEL VAGINAL) 0.75 % vaginal gel Place 1 Applicatorful vaginally 2 (two) times daily.   norethindrone (MICRONOR) 0.35 MG tablet Take  1 tablet by mouth daily.   ondansetron (ZOFRAN) 4 MG tablet Take 1 tablet (4 mg total) by mouth every 8 (eight) hours as needed for nausea or vomiting.       05/05/2022   10:09 AM 02/25/2022    2:56 PM 11/05/2021   10:52 AM 07/25/2020    8:50 AM  GAD 7 : Generalized Anxiety Score  Nervous, Anxious, on Edge _0 0  Control/stop worrying _1 0  Worry too much - different things _2 0  Trouble relaxing _3 0  Restless 0 2 0 0  Easily annoyed or irritable _4 0  Afraid - awful might happen _5 0  Total GAD 7 Score _6 0  Anxiety Difficulty Not difficult at all  Very difficult Not difficult at all Not difficult at all       05/05/2022   10:08 AM 02/25/2022    2:56 PM 11/05/2021   10:51 AM  Depression screen PHQ 2/9  Decreased Interest _7 Down, Depressed, Hopeless _8 PHQ - 2 Score _9 Altered sleeping _10 Tired, decreased energy 1 1 0  Change in appetite _11 Feeling bad or failure about yourself  _12 Trouble concentrating 1 3 0  Moving slowly or fidgety/restless 0 1 0  Suicidal thoughts 0 0 1  PHQ-9 Score _13 Difficult doing work/chores Not difficult at all Somewhat difficult Not difficult at all    BP Readings from Last 3 Encounters:  05/05/22 102/76  04/06/22 102/78  02/25/22 118/64    Physical Exam Vitals and nursing note reviewed.  Constitutional:      General: She is not in acute distress.    Appearance: Normal appearance. She is well-developed.  HENT:     Head: Normocephalic and atraumatic.     Right Ear: Tympanic membrane is retracted.     Left Ear: Tympanic membrane is retracted.     Nose:     Right Sinus: Maxillary sinus tenderness present. No frontal sinus tenderness.     Left Sinus: No maxillary sinus tenderness or frontal sinus tenderness.  Cardiovascular:     Rate and Rhythm: Normal rate and regular rhythm.  Pulmonary:     Effort: Pulmonary effort is normal. No respiratory distress.     Breath sounds: No wheezing or rhonchi.  Musculoskeletal:     Cervical back: Normal range of motion.  Lymphadenopathy:     Cervical: No cervical adenopathy.  Skin:    General: Skin is warm and dry.     Findings: No rash.  Neurological:     Mental Status: She is alert and oriented to person, place, and time.  Psychiatric:        Mood and Affect: Mood normal.        Behavior: Behavior normal.     Wt Readings from Last 3 Encounters:  05/05/22 108 lb (49 kg)  04/06/22 108 lb (49 kg)  02/25/22 107 lb 6.4 oz (48.7 kg)    BP 102/76   Pulse 79   Ht _14  (1.676 m)   Wt 108 lb (49 kg)   LMP  04/29/2022 (Exact Date)   SpO2 99%   BMI 17.43 kg/m   Assessment and Plan: 1. Sinus congestion Suspect some sinus pressure causing mild symptoms  Will treat with Zpak and Flonase If tingling continues, will refer  to ENT - azithromycin (ZITHROMAX Z-PAK) 250 MG tablet; UAD  Dispense: 6 each; Refill: 0   Partially dictated using Editor, commissioning. Any errors are unintentional.  Halina Maidens, MD [facility]  05/05/2022

## 2022-05-05 NOTE — Patient Instructions (Signed)
Use Flonase spray daily Take Zpak - call if still having symptoms after 10 days

## 2022-05-06 ENCOUNTER — Other Ambulatory Visit: Payer: Self-pay | Admitting: Internal Medicine

## 2022-05-06 ENCOUNTER — Encounter: Payer: Self-pay | Admitting: Internal Medicine

## 2022-05-06 DIAGNOSIS — R0981 Nasal congestion: Secondary | ICD-10-CM

## 2022-05-06 MED ORDER — AMOXICILLIN 875 MG PO TABS: 875.0000 mg | ORAL_TABLET | Freq: Two times a day (BID) | ORAL | 0 refills | Status: AC

## 2022-06-30 ENCOUNTER — Encounter: Payer: Self-pay | Admitting: Internal Medicine

## 2022-07-08 ENCOUNTER — Ambulatory Visit (INDEPENDENT_AMBULATORY_CARE_PROVIDER_SITE_OTHER): Payer: Medicaid Other | Admitting: Internal Medicine

## 2022-07-08 ENCOUNTER — Encounter: Payer: Self-pay | Admitting: Internal Medicine

## 2022-07-08 VITALS — BP 102/70 | HR 76 | Ht 66.0 in | Wt 109.0 lb

## 2022-07-08 DIAGNOSIS — D573 Sickle-cell trait: Secondary | ICD-10-CM | POA: Diagnosis not present

## 2022-07-08 DIAGNOSIS — Z30011 Encounter for initial prescription of contraceptive pills: Secondary | ICD-10-CM | POA: Diagnosis not present

## 2022-07-08 LAB — POCT URINE PREGNANCY: Preg Test, Ur: NEGATIVE

## 2022-07-08 MED ORDER — NORETHIN ACE-ETH ESTRAD-FE 1-20 MG-MCG PO TABS: 1.0000 | ORAL_TABLET | Freq: Every day | ORAL | 12 refills | Status: AC

## 2022-07-08 NOTE — Progress Notes (Signed)
Date:  07/08/2022   Name:  Helen Schneider   DOB:  12-06-1990   MRN:  [id number]   Chief Complaint: Contraception She has finished breast feeding and wants to resume her previous birth control method.  She ran out of Micronor and has had 2 menses since then, not regular but otherwise normal.   She feels well.  No headaches, abnormal bleeding, hx DVT, non smoker.  HPI  Lab Results  Component Value Date   NA 138 02/25/2022   K 4.4 02/25/2022   CO2 24 02/25/2022   GLUCOSE 72 02/25/2022   BUN 14 02/25/2022   CREATININE 0.95 02/25/2022   CALCIUM 10.5 (H) 02/25/2022   EGFR 82 02/25/2022   GFRNONAA >60 06/04/2021   No results found for: "CHOL", "HDL", "LDLCALC", "LDLDIRECT", "TRIG", "CHOLHDL" Lab Results  Component Value Date   TSH 2.570 02/25/2022   No results found for: "HGBA1C" Lab Results  Component Value Date   WBC 8.0 02/25/2022   HGB 13.1 02/25/2022   HCT 38.9 02/25/2022   MCV 91 02/25/2022   PLT 322 02/25/2022   Lab Results  Component Value Date   ALT 14 02/25/2022   AST 20 02/25/2022   ALKPHOS 80 02/25/2022   BILITOT 1.6 (H) 02/25/2022   No results found for: "25OHVITD2", "25OHVITD3", "VD25OH"   Review of Systems  Constitutional:  Negative for appetite change, diaphoresis and fatigue.  Respiratory:  Negative for chest tightness and shortness of breath.   Cardiovascular:  Negative for chest pain and leg swelling.  Genitourinary:  Negative for menstrual problem.  Neurological:  Negative for dizziness and headaches.  Psychiatric/Behavioral:  Negative for dysphoric mood. The patient is not nervous/anxious.     Patient Active Problem List   Diagnosis Date Noted   Sickle cell trait ([place]) 11/08/2019   Anxiety and depression 05/16/2018    Allergies  Allergen Reactions   Azithromycin Other (See Comments)    Abdominal cramping   Kiwi Extract    Cherry Hives   Mushroom Extract Complex Hives    Past Surgical History:  Procedure Laterality Date   DILATION  AND CURETTAGE OF UTERUS  01/27/2020   for removal of placenta   DILATION AND CURETTAGE OF UTERUS N/A 06/05/2021   Procedure: DILATATION AND CURETTAGE;  Surgeon: Benjaman Kindler, MD;  Location: [facility] ORS;  Service: Gynecology;  Laterality: N/A;   PERINEUM REPAIR  06/05/2021   Procedure: EPISIOTOMY REPAIR REVISION;  Surgeon: Benjaman Kindler, MD;  Location: [facility] ORS;  Service: Gynecology;;   WISDOM TOOTH EXTRACTION  2013    Social History   Tobacco Use   Smoking status: Never   Smokeless tobacco: Never  Vaping Use   Vaping Use: Never used  Substance Use Topics   Alcohol use: Never   Drug use: Never     Medication list has been reviewed and updated.  Current Meds  Medication Sig   fluticasone (FLONASE) 50 MCG/ACT nasal spray Place 2 sprays into both nostrils daily.   methocarbamol (ROBAXIN) 500 MG tablet TAKE 1 TABLET BY MOUTH AT BEDTIME.   metroNIDAZOLE (METROGEL VAGINAL) 0.75 % vaginal gel Place 1 Applicatorful vaginally 2 (two) times daily.   ondansetron (ZOFRAN) 4 MG tablet Take 1 tablet (4 mg total) by mouth every 8 (eight) hours as needed for nausea or vomiting.       07/08/2022    3:29 PM 05/05/2022   10:09 AM 02/25/2022    2:56 PM 11/05/2021   10:52 AM  GAD 7 : Generalized Anxiety  Score  Nervous, Anxious, on Edge 2 1 2 1   Control/stop worrying 1 1 2 1   Worry too much - different things 1 1 2 1   Trouble relaxing 1 1 2 1   Restless 1 0 2 0  Easily annoyed or irritable 1 1 2 1   Afraid - awful might happen 1 1 2 1   Total GAD 7 Score 8 6 14 6   Anxiety Difficulty Somewhat difficult Not difficult at all Very difficult Not difficult at all       07/08/2022    3:29 PM 05/05/2022   10:08 AM 02/25/2022    2:56 PM  Depression screen PHQ 2/9  Decreased Interest 1 1 1   Down, Depressed, Hopeless 1 1 2   PHQ - 2 Score 2 2 3   Altered sleeping 2 2 1   Tired, decreased energy 1 1 1   Change in appetite 3 1 3   Feeling bad or failure about yourself  1 1 1   Trouble concentrating 1 1  3   Moving slowly or fidgety/restless 1 0 1  Suicidal thoughts 0 0 0  PHQ-9 Score 11 8 13   Difficult doing work/chores Somewhat difficult Not difficult at all Somewhat difficult    BP Readings from Last 3 Encounters:  07/08/22 102/70  05/05/22 102/76  04/06/22 102/78    Physical Exam Vitals and nursing note reviewed.  Constitutional:      General: She is not in acute distress.    Appearance: Normal appearance. She is well-developed.  HENT:     Head: Normocephalic and atraumatic.  Cardiovascular:     Rate and Rhythm: Normal rate and regular rhythm.  Pulmonary:     Effort: Pulmonary effort is normal. No respiratory distress.     Breath sounds: No wheezing or rhonchi.  Musculoskeletal:     Cervical back: Normal range of motion.     Right lower leg: No edema.     Left lower leg: No edema.  Skin:    General: Skin is warm and dry.     Findings: No rash.  Neurological:     General: No focal deficit present.     Mental Status: She is alert and oriented to person, place, and time.  Psychiatric:        Mood and Affect: Mood normal.        Behavior: Behavior normal.     Wt Readings from Last 3 Encounters:  07/08/22 109 lb (49.4 kg)  05/05/22 108 lb (49 kg)  04/06/22 108 lb (49 kg)    BP 102/70 (BP Location: Right Arm)   Pulse 76   Ht 5' 6"  (1.676 m)   Wt 109 lb (49.4 kg)   LMP 06/09/2022   SpO2 95%   BMI 17.59 kg/m   Assessment and Plan: 1. Oral contraception initial prescription Loestrin Rx sent - instructed on timing of initiation Follow up if needed  Pap due in 18 months. - POCT urine pregnancy - negative - norethindrone-ethinyl estradiol-FE (LOESTRIN FE) 1-20 MG-MCG tablet; Take 1 tablet by mouth daily.  Dispense: 28 tablet; Refill: 12  2. Sickle cell trait ([facility]) Stable without bleeding issues.   Partially dictated using Editor, commissioning. Any errors are unintentional.  Halina Maidens, MD [facility]  07/08/2022

## 2022-07-19 ENCOUNTER — Other Ambulatory Visit: Payer: Self-pay | Admitting: Internal Medicine

## 2022-07-19 DIAGNOSIS — B9689 Other specified bacterial agents as the cause of diseases classified elsewhere: Secondary | ICD-10-CM

## 2022-07-20 ENCOUNTER — Other Ambulatory Visit: Payer: Self-pay | Admitting: Internal Medicine

## 2022-07-20 DIAGNOSIS — N760 Acute vaginitis: Secondary | ICD-10-CM

## 2022-07-20 MED ORDER — METRONIDAZOLE 0.75 % VA GEL: 1.0000 | Freq: Two times a day (BID) | VAGINAL | 0 refills | Status: DC

## 2022-07-20 NOTE — Telephone Encounter (Signed)
Unable to refill per protocol, last refill by  Provider 07/20/22. Will refuse duplicate request.  Requested Prescriptions  Pending Prescriptions Disp Refills  . metroNIDAZOLE (METROGEL) 0.75 % vaginal gel [Pharmacy Med Name: METRONIDAZOLE VAGINAL 0.75% GL] 70 g 0    Sig: PLACE 1 APPLICATORFUL VAGINALLY 2 (TWO) TIMES DAILY.     Off-Protocol Failed - 07/19/2022 11:59 AM      Failed - Medication not assigned to a protocol, review manually.      Passed - Valid encounter within last 12 months    Recent Outpatient Visits          1 week ago Oral contraception initial prescription   [place] [facility], Jesse Sans, MD   2 months ago Sinus congestion   [place] [facility] at [facility], Jesse Sans, MD   3 months ago BV (bacterial vaginosis)    [facility] at [facility], Jesse Sans, MD   4 months ago Vertigo   [facility] at [facility], Jesse Sans, MD   8 months ago Eustachian tube dysfunction, bilateral   [facility] at [facility], Jesse Sans, MD

## 2022-08-24 ENCOUNTER — Encounter: Payer: Self-pay | Admitting: Internal Medicine

## 2022-08-24 ENCOUNTER — Ambulatory Visit (INDEPENDENT_AMBULATORY_CARE_PROVIDER_SITE_OTHER): Payer: Medicaid Other | Admitting: Internal Medicine

## 2022-08-24 VITALS — BP 102/74 | HR 83 | Temp 98.2°F | Ht 66.0 in | Wt 107.8 lb

## 2022-08-24 DIAGNOSIS — J0100 Acute maxillary sinusitis, unspecified: Secondary | ICD-10-CM | POA: Diagnosis not present

## 2022-08-24 MED ORDER — AMOXICILLIN 875 MG PO TABS: 875.0000 mg | ORAL_TABLET | Freq: Two times a day (BID) | ORAL | 0 refills | Status: AC

## 2022-08-24 NOTE — Progress Notes (Signed)
Date:  08/24/2022   Name:  Helen Schneider   DOB:  [birth year]   MRN:  [id number]   Chief Complaint: Sinus Problem  Sinus Problem This is a new problem. Episode onset: 2 nights ago. There has been no fever. Associated symptoms include congestion, coughing, shortness of breath, sinus pressure and a sore throat. Pertinent negatives include no chills.  Covid test at home was negative.  Lab Results  Component Value Date   NA 138 02/25/2022   K 4.4 02/25/2022   CO2 24 02/25/2022   GLUCOSE 72 02/25/2022   BUN 14 02/25/2022   CREATININE 0.95 02/25/2022   CALCIUM 10.5 (H) 02/25/2022   EGFR 82 02/25/2022   GFRNONAA >60 06/04/2021   No results found for: "CHOL", "HDL", "LDLCALC", "LDLDIRECT", "TRIG", "CHOLHDL" Lab Results  Component Value Date   TSH 2.570 02/25/2022   No results found for: "HGBA1C" Lab Results  Component Value Date   WBC 8.0 02/25/2022   HGB 13.1 02/25/2022   HCT 38.9 02/25/2022   MCV 91 02/25/2022   PLT 322 02/25/2022   Lab Results  Component Value Date   ALT 14 02/25/2022   AST 20 02/25/2022   ALKPHOS 80 02/25/2022   BILITOT 1.6 (H) 02/25/2022   No results found for: "25OHVITD2", "25OHVITD3", "VD25OH"   Review of Systems  Constitutional:  Negative for chills, fatigue and fever.  HENT:  Positive for congestion, sinus pressure and sore throat.   Respiratory:  Positive for cough and shortness of breath. Negative for chest tightness and wheezing.   Cardiovascular:  Negative for chest pain.  Psychiatric/Behavioral:  Negative for dysphoric mood and sleep disturbance. The patient is not nervous/anxious.     Patient Active Problem List   Diagnosis Date Noted   Sickle cell trait ([place]) 11/08/2019   Anxiety and depression 05/16/2018    Allergies  Allergen Reactions   Azithromycin Other (See Comments)    Abdominal cramping   Kiwi Extract    Cherry Hives   Mushroom Extract Complex Hives    Past Surgical History:  Procedure Laterality Date   DILATION  AND CURETTAGE OF UTERUS  01/27/2020   for removal of placenta   DILATION AND CURETTAGE OF UTERUS N/A 06/05/2021   Procedure: DILATATION AND CURETTAGE;  Surgeon: Benjaman Kindler, MD;  Location: [facility] ORS;  Service: Gynecology;  Laterality: N/A;   PERINEUM REPAIR  06/05/2021   Procedure: EPISIOTOMY REPAIR REVISION;  Surgeon: Benjaman Kindler, MD;  Location: [facility] ORS;  Service: Gynecology;;   WISDOM TOOTH EXTRACTION  2013    Social History   Tobacco Use   Smoking status: Never   Smokeless tobacco: Never  Vaping Use   Vaping Use: Never used  Substance Use Topics   Alcohol use: Never   Drug use: Never     Medication list has been reviewed and updated.  Current Meds  Medication Sig   amoxicillin (AMOXIL) 875 MG tablet Take 1 tablet (875 mg total) by mouth 2 (two) times daily for 10 days.   fluticasone (FLONASE) 50 MCG/ACT nasal spray Place 2 sprays into both nostrils daily.   methocarbamol (ROBAXIN) 500 MG tablet TAKE 1 TABLET BY MOUTH AT BEDTIME.   norethindrone-ethinyl estradiol-FE (LOESTRIN FE) 1-20 MG-MCG tablet Take 1 tablet by mouth daily.   ondansetron (ZOFRAN) 4 MG tablet Take 1 tablet (4 mg total) by mouth every 8 (eight) hours as needed for nausea or vomiting.       08/24/2022    4:24 PM 07/08/2022  3:29 PM 05/05/2022   10:09 AM 02/25/2022    2:56 PM  GAD 7 : Generalized Anxiety Score  Nervous, Anxious, on Edge _0 Control/stop worrying _1 Worry too much - different things _2 Trouble relaxing _3 Restless 1 1 0 2  Easily annoyed or irritable _4 Afraid - awful might happen _5 Total GAD 7 Score _6 Anxiety Difficulty Somewhat difficult Somewhat difficult Not difficult at all Very difficult       08/24/2022    4:24 PM 07/08/2022    3:29 PM 05/05/2022   10:08 AM  Depression screen PHQ 2/9  Decreased Interest _7 Down, Depressed, Hopeless _8 PHQ - 2 Score _9 Altered sleeping _10 Tired, decreased energy _11 Change in appetite _12 Feeling bad or failure about yourself  _13 Trouble concentrating _14 Moving slowly or fidgety/restless 1 1 0  Suicidal thoughts 0 0 0  PHQ-9 Score _15 Difficult doing work/chores Somewhat difficult Somewhat difficult Not difficult at all    BP Readings from Last 3 Encounters:  08/24/22 102/74  07/08/22 102/70  05/05/22 102/76    Physical Exam Constitutional:      Appearance: Normal appearance. She is well-developed.  HENT:     Right Ear: Ear canal and external ear normal. Tympanic membrane is not erythematous or retracted.     Left Ear: Ear canal and external ear normal. Tympanic membrane is not erythematous or retracted.     Nose:     Right Sinus: Maxillary sinus tenderness and frontal sinus tenderness present.     Left Sinus: Maxillary sinus tenderness and frontal sinus tenderness present.     Mouth/Throat:     Mouth: No oral lesions.     Pharynx: Uvula midline. Posterior oropharyngeal erythema present. No oropharyngeal exudate.  Cardiovascular:     Rate and Rhythm: Normal rate and regular rhythm.     Pulses: Normal pulses.     Heart sounds: Normal heart sounds.  Pulmonary:     Breath sounds: Normal breath sounds. No decreased breath sounds, wheezing or rales.  Musculoskeletal:     Cervical back: Normal range of motion.  Lymphadenopathy:     Cervical: No cervical adenopathy.  Neurological:     Mental Status: She is alert and oriented to person, place, and time.     Wt Readings from Last 3 Encounters:  08/24/22 107 lb 12.8 oz (48.9 kg)  07/08/22 109 lb (49.4 kg)  05/05/22 108 lb (49 kg)    BP 102/74   Pulse 83   Temp 98.2 F (36.8 C) (Oral)   Ht _16  (1.676 m)   Wt 107 lb 12.8 oz (48.9 kg)   SpO2 96%   BMI 17.40 kg/m   Assessment and Plan: 1. Acute non-recurrent maxillary sinusitis Continue otc sinus decongestant Delsym for cough - amoxicillin (AMOXIL) 875 MG tablet; Take 1 tablet (875 mg total) by mouth 2 (two)  times daily for 10 days.  Dispense: 20 tablet; Refill: 0   Partially dictated using Editor, commissioning. Any errors are unintentional.  Halina Maidens, MD [facility]  08/24/2022

## 2022-08-29 ENCOUNTER — Other Ambulatory Visit: Payer: Self-pay | Admitting: Internal Medicine

## 2022-08-29 DIAGNOSIS — B9689 Other specified bacterial agents as the cause of diseases classified elsewhere: Secondary | ICD-10-CM

## 2022-08-31 NOTE — Telephone Encounter (Signed)
Requested medication (s) are due for refill today - no  Requested medication (s) are on the active medication list -no  Future visit scheduled -no  Last refill:07/20/22- no longer current on medication list  Notes to clinic: medication not assigned protocol- provider review   Requested Prescriptions  Pending Prescriptions Disp Refills   metroNIDAZOLE (METROGEL) 0.75 % vaginal gel [Pharmacy Med Name: METRONIDAZOLE VAGINAL 0.75% GL] 70 g 0    Sig: Place 1 Applicatorful vaginally 2 (two) times daily.     Off-Protocol Failed - 08/29/2022 11:54 PM      Failed - Medication not assigned to a protocol, review manually.      Passed - Valid encounter within last 12 months    Recent Outpatient Visits           1 week ago Acute non-recurrent maxillary sinusitis   [place] [facility] at [facility], Nyoka Cowden, MD   1 month ago Oral contraception initial prescription   [place] [facility] at [facility], Nyoka Cowden, MD   3 months ago Sinus congestion   [place] [facility] at [facility], Nyoka Cowden, MD   4 months ago BV (bacterial vaginosis)   [place] [facility] at [facility], Nyoka Cowden, MD   6 months ago Vertigo   [place] [facility] at [facility], Nyoka Cowden, MD                 Requested Prescriptions  Pending Prescriptions Disp Refills   metroNIDAZOLE (METROGEL) 0.75 % vaginal gel [Pharmacy Med Name: METRONIDAZOLE VAGINAL 0.75% GL] 70 g 0    Sig: Place 1 Applicatorful vaginally 2 (two) times daily.     Off-Protocol Failed - 08/29/2022 11:54 PM      Failed - Medication not assigned to a protocol, review manually.      Passed - Valid encounter within last 12 months    Recent Outpatient Visits           1 week ago Acute non-recurrent maxillary sinusitis   [place] [facility] at [facility], Nyoka Cowden, MD   1 month ago Oral contraception initial prescription   [place] [facility] at [facility], Nyoka Cowden, MD   3 months ago Sinus congestion   [place] [facility] at [facility], Nyoka Cowden, MD   4 months ago BV (bacterial vaginosis)   [place] [facility] at [facility], Nyoka Cowden, MD   6 months ago Vertigo   [facility] at [facility], Nyoka Cowden, MD

## 2022-11-06 ENCOUNTER — Other Ambulatory Visit: Payer: Self-pay | Admitting: Gastroenterology

## 2022-11-06 DIAGNOSIS — R1084 Generalized abdominal pain: Secondary | ICD-10-CM

## 2022-11-06 DIAGNOSIS — R1013 Epigastric pain: Secondary | ICD-10-CM

## 2022-11-06 DIAGNOSIS — R198 Other specified symptoms and signs involving the digestive system and abdomen: Secondary | ICD-10-CM

## 2022-11-06 DIAGNOSIS — R194 Change in bowel habit: Secondary | ICD-10-CM

## 2022-11-06 DIAGNOSIS — R17 Unspecified jaundice: Secondary | ICD-10-CM

## 2022-11-17 ENCOUNTER — Ambulatory Visit
Admission: RE | Admit: 2022-11-17 | Discharge: 2022-11-17 | Disposition: A | Discharge status: Home / Self Care | Payer: Medicaid Other | Source: Ambulatory Visit | Attending: Gastroenterology | Admitting: Gastroenterology

## 2022-11-17 DIAGNOSIS — R194 Change in bowel habit: Secondary | ICD-10-CM | POA: Insufficient documentation

## 2022-11-17 DIAGNOSIS — R1013 Epigastric pain: Secondary | ICD-10-CM | POA: Insufficient documentation

## 2022-11-17 DIAGNOSIS — R198 Other specified symptoms and signs involving the digestive system and abdomen: Secondary | ICD-10-CM | POA: Insufficient documentation

## 2022-11-17 DIAGNOSIS — R17 Unspecified jaundice: Secondary | ICD-10-CM | POA: Diagnosis present

## 2022-11-17 DIAGNOSIS — R1084 Generalized abdominal pain: Secondary | ICD-10-CM | POA: Insufficient documentation

## 2022-11-17 MED ORDER — IOHEXOL 300 MG/ML  SOLN
75.0000 mL | Freq: Once | INTRAMUSCULAR | Status: AC | PRN
  Administered 2022-11-17: 75 mL via INTRAVENOUS

## 2022-12-07 ENCOUNTER — Other Ambulatory Visit: Payer: Self-pay | Admitting: Physician Assistant

## 2022-12-07 ENCOUNTER — Ambulatory Visit
Admission: RE | Admit: 2022-12-07 | Discharge: 2022-12-07 | Disposition: A | Discharge status: Home / Self Care | Payer: Medicaid Other | Source: Ambulatory Visit | Attending: Physician Assistant | Admitting: Physician Assistant

## 2022-12-07 DIAGNOSIS — M79602 Pain in left arm: Secondary | ICD-10-CM | POA: Insufficient documentation

## 2023-02-04 ENCOUNTER — Encounter: Payer: Self-pay | Admitting: *Deleted

## 2023-02-05 ENCOUNTER — Ambulatory Visit: Payer: Medicaid Other | Admitting: Anesthesiology

## 2023-02-05 ENCOUNTER — Encounter
Admission: RE | Disposition: A | Discharge status: Home / Self Care | Payer: Self-pay | Source: Home / Self Care | Attending: Gastroenterology

## 2023-02-05 ENCOUNTER — Ambulatory Visit
Admission: RE | Admit: 2023-02-05 | Discharge: 2023-02-05 | Disposition: A | Discharge status: Home / Self Care | Payer: Medicaid Other | Attending: Gastroenterology | Admitting: Gastroenterology

## 2023-02-05 ENCOUNTER — Encounter: Payer: Self-pay | Admitting: *Deleted

## 2023-02-05 DIAGNOSIS — K2970 Gastritis, unspecified, without bleeding: Secondary | ICD-10-CM | POA: Insufficient documentation

## 2023-02-05 DIAGNOSIS — K640 First degree hemorrhoids: Secondary | ICD-10-CM | POA: Diagnosis not present

## 2023-02-05 DIAGNOSIS — K589 Irritable bowel syndrome without diarrhea: Secondary | ICD-10-CM | POA: Insufficient documentation

## 2023-02-05 DIAGNOSIS — D573 Sickle-cell trait: Secondary | ICD-10-CM | POA: Insufficient documentation

## 2023-02-05 DIAGNOSIS — K3189 Other diseases of stomach and duodenum: Secondary | ICD-10-CM | POA: Insufficient documentation

## 2023-02-05 DIAGNOSIS — K219 Gastro-esophageal reflux disease without esophagitis: Secondary | ICD-10-CM | POA: Diagnosis not present

## 2023-02-05 DIAGNOSIS — R194 Change in bowel habit: Secondary | ICD-10-CM | POA: Diagnosis present

## 2023-02-05 DIAGNOSIS — R1013 Epigastric pain: Secondary | ICD-10-CM | POA: Insufficient documentation

## 2023-02-05 DIAGNOSIS — Z79899 Other long term (current) drug therapy: Secondary | ICD-10-CM | POA: Diagnosis not present

## 2023-02-05 DIAGNOSIS — Z793 Long term (current) use of hormonal contraceptives: Secondary | ICD-10-CM | POA: Insufficient documentation

## 2023-02-05 HISTORY — DX: Sickle-cell trait: D57.3

## 2023-02-05 HISTORY — DX: Other disorders of bilirubin metabolism: E80.6

## 2023-02-05 HISTORY — PX: ESOPHAGOGASTRODUODENOSCOPY (EGD) WITH PROPOFOL: SHX5813

## 2023-02-05 HISTORY — PX: COLONOSCOPY WITH PROPOFOL: SHX5780

## 2023-02-05 LAB — POCT PREGNANCY, URINE: Preg Test, Ur: NEGATIVE

## 2023-02-05 SURGERY — COLONOSCOPY WITH PROPOFOL
Anesthesia: General

## 2023-02-05 MED ORDER — GLYCOPYRROLATE 0.2 MG/ML IJ SOLN
INTRAMUSCULAR | Status: DC | PRN
  Administered 2023-02-05: .1 mg via INTRAVENOUS

## 2023-02-05 MED ORDER — PROPOFOL 10 MG/ML IV BOLUS
INTRAVENOUS | Status: DC | PRN
  Administered 2023-02-05 (×2): 50 mg via INTRAVENOUS
  Administered 2023-02-05: 100 mg via INTRAVENOUS
  Administered 2023-02-05: 50 mg via INTRAVENOUS

## 2023-02-05 MED ORDER — GLYCOPYRROLATE 0.2 MG/ML IJ SOLN
INTRAMUSCULAR | Status: AC
  Filled 2023-02-05: qty 1

## 2023-02-05 MED ORDER — LIDOCAINE HCL (PF) 2 % IJ SOLN
INTRAMUSCULAR | Status: AC
  Filled 2023-02-05: qty 5

## 2023-02-05 MED ORDER — SODIUM CHLORIDE 0.9 % IV SOLN
INTRAVENOUS | Status: DC
  Administered 2023-02-05: 20 mL/h via INTRAVENOUS

## 2023-02-05 MED ORDER — PROPOFOL 500 MG/50ML IV EMUL
INTRAVENOUS | Status: DC | PRN
  Administered 2023-02-05: 125 ug/kg/min via INTRAVENOUS

## 2023-02-05 MED ORDER — LIDOCAINE HCL (CARDIAC) PF 100 MG/5ML IV SOSY
PREFILLED_SYRINGE | INTRAVENOUS | Status: DC | PRN
  Administered 2023-02-05: 50 mg via INTRAVENOUS

## 2023-02-05 MED ORDER — PROPOFOL 1000 MG/100ML IV EMUL
INTRAVENOUS | Status: AC
  Filled 2023-02-05: qty 100

## 2023-02-05 NOTE — Op Note (Signed)
[facility] Gastroenterology Patient Name: Helen Schneider Procedure Date: 02/05/2023 7:33 AM MRN: [id number] Account #: [id number] Date of Birth: 1991-07-07 Admit Type: Outpatient Age: 32 Room: [facility] ENDO ROOM 3 Gender: Female Note Status: Finalized Instrument Name: Patton Salles Endoscope [id number] Procedure:             Upper GI endoscopy Indications:           Dyspepsia Providers:             Eather Colas MD, MD Medicines:             Monitored Anesthesia Care Complications:         No immediate complications. Estimated blood loss:                         Minimal. Procedure:             Pre-Anesthesia Assessment:                        - Prior to the procedure, a History and Physical was                         performed, and patient medications and allergies were                         reviewed. The patient is competent. The risks and                         benefits of the procedure and the sedation options and                         risks were discussed with the patient. All questions                         were answered and informed consent was obtained.                         Patient identification and proposed procedure were                         verified by the physician, the nurse, the                         anesthesiologist, the anesthetist and the technician                         in the endoscopy suite. Mental Status Examination:                         alert and oriented. Airway Examination: normal                         oropharyngeal airway and neck mobility. Respiratory                         Examination: clear to auscultation. CV Examination:                         normal. Prophylactic Antibiotics: The patient does not  require prophylactic antibiotics. Prior                         Anticoagulants: The patient has taken no anticoagulant                         or antiplatelet agents. ASA Grade Assessment: I - A                          normal, healthy patient. After reviewing the risks and                         benefits, the patient was deemed in satisfactory                         condition to undergo the procedure. The anesthesia                         plan was to use monitored anesthesia care (MAC).                         Immediately prior to administration of medications,                         the patient was re-assessed for adequacy to receive                         sedatives. The heart rate, respiratory rate, oxygen                         saturations, blood pressure, adequacy of pulmonary                         ventilation, and response to care were monitored                         throughout the procedure. The physical status of the                         patient was re-assessed after the procedure.                        After obtaining informed consent, the endoscope was                         passed under direct vision. Throughout the procedure,                         the patient's blood pressure, pulse, and oxygen                         saturations were monitored continuously. The Endoscope                         was introduced through the mouth, and advanced to the                         second part of duodenum. The upper GI endoscopy was  accomplished without difficulty. The patient tolerated                         the procedure well. Findings:      The examined esophagus was normal.      The entire examined stomach was normal. Biopsies were taken with a cold       forceps for Helicobacter pylori testing. Estimated blood loss was       minimal.      A single 8 mm nodule with a localized distribution was found in the       second portion of the duodenum. This was likely just a very prominent       ampulla. Biopsies were taken with a cold forceps for histology.       Estimated blood loss was minimal. Impression:            - Normal esophagus.                         - Normal stomach. Biopsied.                        - Nodule found in the duodenum. Biopsied. Recommendation:        - Discharge patient to home.                        - Resume previous diet.                        - Continue present medications.                        - Await pathology results.                        - Return to referring physician as previously                         scheduled. Procedure Code(s):     --- Professional ---                        [phone], Esophagogastroduodenoscopy, flexible,                         transoral; with biopsy, single or multiple Diagnosis Code(s):     --- Professional ---                        K31.89, Other diseases of stomach and duodenum                        R10.13, Epigastric pain CPT copyright 2022 American Medical Association. All rights reserved. The codes documented in this report are preliminary and upon coder review may  be revised to meet current compliance requirements. Eather Colas MD, MD 02/05/2023 8:30:36 AM Number of Addenda: 0 Note Initiated On: 02/05/2023 7:33 AM Estimated Blood Loss:  Estimated blood loss was minimal.      [facility]

## 2023-02-05 NOTE — Interval H&P Note (Signed)
History and Physical Interval Note:  02/05/2023 7:57 AM  Helen Schneider  has presented today for surgery, with the diagnosis of DYDPEPSIA,BOWEL HABIT CHANGES,ABDOMINAL PAIN,IRREGULAR HABITS.  The various methods of treatment have been discussed with the patient and family. After consideration of risks, benefits and other options for treatment, the patient has consented to  Procedure(s): COLONOSCOPY WITH PROPOFOL (N/A) ESOPHAGOGASTRODUODENOSCOPY (EGD) WITH PROPOFOL (N/A) as a surgical intervention.  The patient's history has been reviewed, patient examined, no change in status, stable for surgery.  I have reviewed the patient's chart and labs.  Questions were answered to the patient's satisfaction.     Regis Bill  Ok to proceed with EGD/Colonoscopy

## 2023-02-05 NOTE — Transfer of Care (Signed)
Immediate Anesthesia Transfer of Care Note  Patient: Helen Schneider  Procedure(s) Performed: COLONOSCOPY WITH PROPOFOL ESOPHAGOGASTRODUODENOSCOPY (EGD) WITH PROPOFOL  Patient Location: PACU  Anesthesia Type:General  Level of Consciousness: unresponsive  Airway & Oxygen Therapy: Patient Spontanous Breathing  Post-op Assessment: Report given to RN and Post -op Vital signs reviewed and stable  Post vital signs: Reviewed and stable  Last Vitals:  Vitals Value Taken Time  BP 98/70 02/05/23 0827  Temp 36.2 C 02/05/23 0827  Pulse 81 02/05/23 0827  Resp 19 02/05/23 0827  SpO2 100 % 02/05/23 0827  Vitals shown include unvalidated device data.  Last Pain:  Vitals:   02/05/23 0827  TempSrc: Temporal  PainSc: Asleep         Complications: No notable events documented.

## 2023-02-05 NOTE — Op Note (Signed)
[facility] Gastroenterology Patient Name: Helen Schneider Procedure Date: 02/05/2023 7:33 AM MRN: [id number] Account #: [id number] Date of Birth: [birth year] Admit Type: Outpatient Age: 32 Room: [facility] ENDO ROOM 3 Gender: Female Note Status: Finalized Instrument Name: Peds Colonoscope [id number] Procedure:             Colonoscopy Indications:           Generalized abdominal pain, Change in bowel habits Providers:             Eather Colas MD, MD Medicines:             Monitored Anesthesia Care Complications:         No immediate complications. Procedure:             Pre-Anesthesia Assessment:                        - Prior to the procedure, a History and Physical was                         performed, and patient medications and allergies were                         reviewed. The patient is competent. The risks and                         benefits of the procedure and the sedation options and                         risks were discussed with the patient. All questions                         were answered and informed consent was obtained.                         Patient identification and proposed procedure were                         verified by the physician, the nurse, the                         anesthesiologist, the anesthetist and the technician                         in the endoscopy suite. Mental Status Examination:                         alert and oriented. Airway Examination: normal                         oropharyngeal airway and neck mobility. Respiratory                         Examination: clear to auscultation. CV Examination:                         normal. Prophylactic Antibiotics: The patient does not                         require prophylactic antibiotics. Prior  Anticoagulants: The patient has taken no anticoagulant                         or antiplatelet agents. ASA Grade Assessment: I - A                         normal,  healthy patient. After reviewing the risks and                         benefits, the patient was deemed in satisfactory                         condition to undergo the procedure. The anesthesia                         plan was to use monitored anesthesia care (MAC).                         Immediately prior to administration of medications,                         the patient was re-assessed for adequacy to receive                         sedatives. The heart rate, respiratory rate, oxygen                         saturations, blood pressure, adequacy of pulmonary                         ventilation, and response to care were monitored                         throughout the procedure. The physical status of the                         patient was re-assessed after the procedure.                        After obtaining informed consent, the colonoscope was                         passed under direct vision. Throughout the procedure,                         the patient's blood pressure, pulse, and oxygen                         saturations were monitored continuously. The                         Colonoscope was introduced through the anus and                         advanced to the the terminal ileum. The colonoscopy                         was performed without difficulty. The patient  tolerated the procedure well. The quality of the bowel                         preparation was good. The terminal ileum, ileocecal                         valve, appendiceal orifice, and rectum were                         photographed. Findings:      The perianal and digital rectal examinations were normal.      The terminal ileum appeared normal.      Internal hemorrhoids were found during retroflexion. The hemorrhoids       were Grade I (internal hemorrhoids that do not prolapse).      The exam was otherwise without abnormality on direct and retroflexion       views. Impression:             - The examined portion of the ileum was normal.                        - Internal hemorrhoids.                        - The examination was otherwise normal on direct and                         retroflexion views.                        - No specimens collected. Recommendation:        - Discharge patient to home.                        - Resume previous diet.                        - Continue present medications.                        - Repeat colonoscopy at age 29 for screening purposes.                        - Return to referring physician as previously                         scheduled. Procedure Code(s):     --- Professional ---                        [phone], Colonoscopy, flexible; diagnostic, including                         collection of specimen(s) by brushing or washing, when                         performed (separate procedure) Diagnosis Code(s):     --- Professional ---                        K64.0, First degree hemorrhoids  R10.84, Generalized abdominal pain                        R19.4, Change in bowel habit CPT copyright 2022 American Medical Association. All rights reserved. The codes documented in this report are preliminary and upon coder review may  be revised to meet current compliance requirements. Eather Colas MD, MD 02/05/2023 8:33:38 AM Number of Addenda: 0 Note Initiated On: 02/05/2023 7:33 AM Scope Withdrawal Time: 0 hours 6 minutes 36 seconds  Total Procedure Duration: 0 hours 11 minutes 52 seconds  Estimated Blood Loss:  Estimated blood loss: none.      [facility]

## 2023-02-05 NOTE — Anesthesia Preprocedure Evaluation (Signed)
Anesthesia Evaluation  Patient identified by MRN, date of birth, ID band Patient awake    Reviewed: Allergy & Precautions, H&P , NPO status , Patient's Chart, lab work & pertinent test results, reviewed documented beta blocker date and time   History of Anesthesia Complications Negative for: history of anesthetic complications  Airway Mallampati: II  TM Distance: >3 FB Neck ROM: full    Dental  (+) Dental Advidsory Given, Caps, Teeth Intact, Missing   Pulmonary neg pulmonary ROS   Pulmonary exam normal breath sounds clear to auscultation       Cardiovascular Exercise Tolerance: Good negative cardio ROS Normal cardiovascular exam Rhythm:regular Rate:Normal     Neuro/Psych negative neurological ROS  negative psych ROS   GI/Hepatic Neg liver ROS,GERD  ,,  Endo/Other  negative endocrine ROS    Renal/GU negative Renal ROS  negative genitourinary   Musculoskeletal   Abdominal   Peds  Hematology  (+) Blood dyscrasia, Sickle cell trait and anemia   Anesthesia Other Findings Past Medical History: 2016: Constipation 06/04/2021: Delayed postpartum hemorrhage No date: Hyperbilirubinemia 2016: IBS (irritable bowel syndrome) No date: Iron deficiency anemia 01/24/2020: Preterm premature rupture of membranes (PPROM) with onset  of labor within 24 hours of rupture in second trimester, antepartum No date: Sickle cell trait (HCC)   Reproductive/Obstetrics negative OB ROS                             Anesthesia Physical Anesthesia Plan  ASA: 2  Anesthesia Plan: General   Post-op Pain Management:    Induction: Intravenous  PONV Risk Score and Plan: 3 and Propofol infusion and TIVA  Airway Management Planned: Natural Airway and Nasal Cannula  Additional Equipment:   Intra-op Plan:   Post-operative Plan:   Informed Consent: I have reviewed the patients History and Physical, chart, labs and  discussed the procedure including the risks, benefits and alternatives for the proposed anesthesia with the patient or authorized representative who has indicated his/her understanding and acceptance.     Dental Advisory Given  Plan Discussed with: Anesthesiologist, CRNA and Surgeon  Anesthesia Plan Comments:        Anesthesia Quick Evaluation

## 2023-02-05 NOTE — Anesthesia Postprocedure Evaluation (Signed)
Anesthesia Post Note  Patient: Helen Schneider  Procedure(s) Performed: COLONOSCOPY WITH PROPOFOL ESOPHAGOGASTRODUODENOSCOPY (EGD) WITH PROPOFOL  Patient location during evaluation: Endoscopy Anesthesia Type: General Level of consciousness: awake and alert Pain management: pain level controlled Vital Signs Assessment: post-procedure vital signs reviewed and stable Respiratory status: spontaneous breathing, nonlabored ventilation, respiratory function stable and patient connected to nasal cannula oxygen Cardiovascular status: blood pressure returned to baseline and stable Postop Assessment: no apparent nausea or vomiting Anesthetic complications: no   No notable events documented.   Last Vitals:  Vitals:   02/05/23 0847 02/05/23 0857  BP: (!) 127/94   Pulse:    Resp:  19  Temp:    SpO2:      Last Pain:  Vitals:   02/05/23 0857  TempSrc:   PainSc: 0-No pain                 Lenard Simmer

## 2023-02-05 NOTE — H&P (Signed)
Outpatient short stay form Pre-procedure 02/05/2023  Regis Bill, MD  Primary Physician: Enid Baas, MD  Reason for visit:  Dyspepsia/Change in bowel habits  History of present illness:    32 y/o lady with history of IBS here for EGD/Colonoscopy for dyspepsia/change in bowel habits after a bout of food poisoning. No family history of GI malignancies. No blood thinners. No significant abdominal surgeries.    Current Facility-Administered Medications:    0.9 %  sodium chloride infusion, , Intravenous, Continuous, Vane Yapp, Rossie Muskrat, MD, Last Rate: 20 mL/hr at 02/05/23 0730, 20 mL/hr at 02/05/23 0730  Medications Prior to Admission  Medication Sig Dispense Refill Last Dose   fluticasone (FLONASE) 50 MCG/ACT nasal spray Place 2 sprays into both nostrils daily. 16 g 6 02/04/2023   norethindrone-ethinyl estradiol-FE (LOESTRIN FE) 1-20 MG-MCG tablet Take 1 tablet by mouth daily. 28 tablet 12 02/04/2023   ondansetron (ZOFRAN) 4 MG tablet Take 1 tablet (4 mg total) by mouth every 8 (eight) hours as needed for nausea or vomiting. 20 tablet 0 Past Week   methocarbamol (ROBAXIN) 500 MG tablet TAKE 1 TABLET BY MOUTH AT BEDTIME. (Patient not taking: Reported on 02/05/2023) 30 tablet 0 Not Taking     Allergies  Allergen Reactions   Azithromycin Other (See Comments)    Abdominal cramping   Kiwi Extract    Cherry Hives   Mushroom Extract Complex Hives     Past Medical History:  Diagnosis Date   Constipation 2016   Delayed postpartum hemorrhage 06/04/2021   Hyperbilirubinemia    IBS (irritable bowel syndrome) 2016   Iron deficiency anemia    Preterm premature rupture of membranes (PPROM) with onset of labor within 24 hours of rupture in second trimester, antepartum 01/24/2020   Sickle cell trait (HCC)     Review of systems:  Otherwise negative.    Physical Exam  Gen: Alert, oriented. Appears stated age.  HEENT: PERRLA. Lungs: No respiratory distress CV: RRR Abd:  soft, benign, no masses Ext: No edema   Planned procedures: Proceed with EGD/colonoscopy. The patient understands the nature of the planned procedure, indications, risks, alternatives and potential complications including but not limited to bleeding, infection, perforation, damage to internal organs and possible oversedation/side effects from anesthesia. The patient agrees and gives consent to proceed.  Please refer to procedure notes for findings, recommendations and patient disposition/instructions.     Regis Bill, MD [facility]

## 2023-02-08 ENCOUNTER — Encounter: Payer: Self-pay | Admitting: Gastroenterology

## 2023-02-08 LAB — SURGICAL PATHOLOGY

## 2023-03-02 ENCOUNTER — Other Ambulatory Visit: Payer: Self-pay | Admitting: Gastroenterology

## 2023-03-02 DIAGNOSIS — R1013 Epigastric pain: Secondary | ICD-10-CM

## 2023-03-02 DIAGNOSIS — R17 Unspecified jaundice: Secondary | ICD-10-CM

## 2023-03-12 ENCOUNTER — Ambulatory Visit
Admission: RE | Admit: 2023-03-12 | Discharge: 2023-03-12 | Disposition: A | Discharge status: Home / Self Care | Payer: Medicaid Other | Source: Ambulatory Visit | Attending: Gastroenterology | Admitting: Gastroenterology

## 2023-03-12 ENCOUNTER — Encounter
Admission: RE | Admit: 2023-03-12 | Discharge: 2023-03-12 | Disposition: A | Discharge status: Home / Self Care | Payer: Medicaid Other | Source: Ambulatory Visit | Attending: Gastroenterology | Admitting: Gastroenterology

## 2023-03-12 DIAGNOSIS — R17 Unspecified jaundice: Secondary | ICD-10-CM | POA: Diagnosis present

## 2023-03-12 DIAGNOSIS — R1013 Epigastric pain: Secondary | ICD-10-CM

## 2023-03-12 MED ORDER — TECHNETIUM TC 99M SULFUR COLLOID
2.2700 | Freq: Once | INTRAVENOUS | Status: AC
  Administered 2023-03-12: 2.27 via ORAL

## 2024-05-25 NOTE — Discharge Summary (Signed)
 Obstetrics Postpartum Discharge Summary   Admit Date:  05/23/2024 Discharge Date:  05/25/2024  Primary OB: [facility])     Admission Diagnoses:  33 y.o. at [redacted]w[redacted]d pregnant admitted for Bleeding in Pregnancy, decreased fetal movement  Pregnancy / Antepartum complications:    Cervical incompetence affecting pregnancy, antepartum (HHS-HCC)   Sickle cell trait ()   Anxiety and depression   Maternal iron  deficiency anemia affecting pregnancy in third trimester, antepartum (HHS-HCC)   History of preterm delivery, currently pregnant, unspecified trimester (HHS-HCC)   Hx of cervical cerclage, currently pregnant, third trimester (HHS-HCC)   Discharge Diagnoses: S/p Vaginal, Spontaneous on May 23, 2024 PP hemorrhage  Delivery Details   Maternal Gravity and Parity: [id number] Gestational Age at Delivery: [redacted]w[redacted]d Baby Name / Sex: Baby Boy  Shaunna Hosking / female Date/Time of Birth: 05/23/2024 / 10:51 PM Birth Weight: 3.31 kg (7 lb 4.8 oz) Apgars: 8  9        Delivery type: Vaginal, Spontaneous Lacerations:   Episiotomy: None  Perineal: 1st - repaired                     Placenta: Spontaneous / Intact Labor Complications:  None Anesthesia: Epidural                  Delivering clinician Attending on call Resident Intern Charge Nurse Delivery Nurse Midwife Intern Burnard Chiquita Estefana Joshua Elenor Arlean, MD Burnard Chiquita Estefana Marolyn Legrand FORBES Claudene Charmaine Lajean, Makayla Reel, Deette Hockinson, [facility] Course:    Antepartum/Intrapartum: Pt presented for evaluation of vaginal bleeding and decreased fetal movement in the setting of contractions. She was kept for induction. She delivered her baby boy over 1st degree laceration. She had an EBL of 1225 mL and received uterotonics and a Bakri balloon.  She was meeting all milestones and stable for discharge on PPD #2.     Postpartum: Ms.Worthey was transitioned to Postpartum  service. She recovered postpartum with hospital course outlined below.  On day of discharge, she had met all postpartum goals.   Neonatal Course: unremarkable  Hospital Course by Problem: Massive OB Hemorrhage Admission Hb was 11.0 and EBL from delivery was 1100 m. Hemostasis achieved with uterotonics, bimanual massage, and bakari balloon placement at 01:20 AM on 9/3.             - removed 05/24/2024 - UTEROTONICS: Misoprostol  800 mcg rectally, methergine  0.2mg  IM x 1, and 2 bags of Pitocin - Bakri balloon / intrauterine balloon: metronidazole  500 mg IV x 1 - No need to receive ampicillin  2g IV x 1 as patient received penicillin for GBS ppx - Plan to follow labs to assess for adequate resuscitation; CBC ordered prior to discharge - Denies symptoms of anemia  this morning - VSS, Hgb stable as of yesterday, bakri previously removed.  - Consider oral Iron  postpartum fror Hb <10   Routine Care: - Continue postpartum care. - Baby: female Circumcision desired [x]  Consented, [x]  Orders, ([x]  Voided, [x]  Vit K) []  Completed - Pain Management: Alternate ibuprofen  and acetaminophen  for optimal pain management. - Consults: n/a - Postpartum follow-up needs: Routine postpartum check in 4-6 weeks with [facility] - Anticipate discharge on postpartum day #2.     Health Maintenance / Postpartum:   ABO/RH: O Positive - Rhogam not indicated  VTE Risk: Low - Recommend mechanical prophylaxis until ambulation, no further VTE prophylaxis  recommended., Rx Lovenox: Not indicated  Immunizations:   Jump to review immunizations <redacted file path>  Action  Rubella Positive  (01/31 1021): immune  TdaP: 03/10/2024 up to date  Influenza: 07/21/2023 not indicated   Feeding method: breastfeeding:  , Pumping:   Contraception plan at discharge: OCP: Progestin only pill prescribed. Pap:  Diagnostic Interpretation  Date Value Ref Range Status  11/14/2020 NEGATIVE FOR INTRAEPITHELIAL LESION OR  MALIGNANCY  Final          Discharge:  No opioid prescriptions indicated.   Disposition: Home  Follow-up appointment: Routine postpartum check in 4-6 weeks with [facility]  The delivery summary has been signed and the discharge summary has been refreshed before signing.   Attestation Statement:   I personally performed the service, non-incident to. [facility])   AMANDA LYNN O'BRIANT  05/25/2024 ----------------------------------------------------- Future Appointments  Date Time Provider Department Center  06/22/2024  3:20 PM Gerard Barbie Catholic, MD [facility]     Medication List     START taking these medications    acetaminophen  325 MG tablet Commonly known as: TYLENOL  Take 2 tablets (650 mg total) by mouth every 4 (four) hours as needed for up to 10 days   docusate 100 MG capsule Commonly known as: COLACE Take 1 capsule (100 mg total) by mouth 2 (two) times daily for 10 days   ferrous sulfate  325 (65 FE) MG EC tablet Take 1 tablet (325 mg total) by mouth every other day   ibuprofen  600 MG tablet Commonly known as: MOTRIN  Take 1 tablet (600 mg total) by mouth every 6 (six) hours as needed for up to 10 days   norethindrone 0.35 mg tablet Commonly known as: MICRONOR Take 1 tablet (0.35 mg total) by mouth once daily       CONTINUE taking these medications    ascorbic acid  (vitamin C) 1000 MG tablet Commonly known as: VITAMIN C Take 1 tablet (1,000 mg total) by mouth once daily   magnesium oxide 400 mg magnesium Tab Take 400 mg by mouth once daily   melatonin 3 mg tablet TAKE 1 TABLET (3 MG TOTAL) BY MOUTH ONCE DAILY   omeprazole 20 MG EC tablet Commonly known as: PriLOSEC OTC   prenatal vitamin-iron -FA tablet Commonly known as: PRENATE PLUS   riboflavin (vitamin B2) 400 mg Tab Take 400 mg by mouth once daily       STOP taking these medications    cyclobenzaprine 10 MG tablet Commonly known as: FLEXERIL    prochlorperazine 10 MG tablet Commonly known as: COMPAZINE         Where to Get Your Medications     These medications were sent to [facility]  [address], [place] [address]    Hours: 8:30-5 MON-FRI Phone: [phone]  acetaminophen  325 MG tablet docusate 100 MG capsule ibuprofen  600 MG tablet norethindrone 0.35 mg tablet

## 2024-05-26 NOTE — Progress Notes (Signed)
 TRANSITIONAL CARE TELEPHONE ENCOUNTER NOTE   Helen Schneider is a 33 y.o. female who was contacted after a hospitalization at [facility] on 05/23/24-05/25/24 for SVD/ postpartum hemorrhage, Hx anxiety/depression:  The following were reviewed with the patient today:   Note: Only barriers and interventions are noted below. If no flowsheet data is found, then no issues were identified or the patient was not able to be reached.    Patient Knowledge and Self Care      05/26/2024    1:20 PM  Patient Knowledge and Self-Care  Did the patient/caregiver understand the primary reason for hospitalization? Yes  Is the patient or the patient's caregiver able to describe what is necessary for self care at this time? Yes  Are there any additional educational needs identified today? No     Discharge Follow-Up Appointment      05/26/2024    1:20 PM  Appointments  Does the patient have an appropriate follow-up visit scheduled within 14 days of discharge? No  Date of Appointment: 06/21/2024  Was assistance offered to scheduled/reschedule the hospital followup appointment? No assistance offered  Are there any barriers to the patient keeping their appointment? No  Has the patient been hospitalized more than once in the past 30 days? No    Financial Resource Strain: Low Risk  (05/26/2024)   Overall Financial Resource Strain (CARDIA)   . Difficulty of Paying Living Expenses: Not hard at all    Transportation Needs: No Transportation Needs (05/26/2024)   PRAPARE - Transportation   . Lack of Transportation (Medical): No   . Lack of Transportation (Non-Medical): No    Food Insecurity: No Food Insecurity (05/26/2024)   Hunger Vital Sign   . Worried About Programme researcher, broadcasting/film/video in the Last Year: Never true   . Ran Out of Food in the Last Year: Never true      Medication Review      05/26/2024    1:20 PM  Medication Review  Were any new medicines prescribed at discharge? Yes  Was the patient able to  get all of their prescriptions? Yes  Does the patient understand their medications including how to take them? Yes  Were there any medication interventions/escalations? Yes  Interventions/Escalations: Provided medication education and reinforcement  Reconciled current and discharge medications? (RN and Pharmacist ONLY) Yes  Additional comments compared med list with AVS      New or Worsening Health Issues      05/26/2024    1:20 PM  New or Worsening Health Issues  Is the patient experiencing any new or worsening symptoms after discharge? No      Home Health       05/26/2024    1:20 PM  Home Health  Has your home care agency contacted you? Not Applicable    Is patient eligible for TCM coding?:     05/26/2024    1:20 PM  [facility] Coding Eligibility  Did you communicate with the patient and/or caregiver within 2 business days of discharge OR Did you document two or more separate communication attempts within 2 business days of discharge? Yes  Is the patient eligible for TCM billing? No    The importance of keeping the appointment was emphasized to her.  Future Appointments     Date/Time Provider Department Center Visit Type   06/22/2024 3:20 PM (Arrive by 3:05 PM) Gerard Barbie Catholic, MD [facility] POSTPARTUM       05/26/24 1315  Over the  past 2 weeks, how often have you been bothered by any of the following problems?  Little interest or pleasure in doing things Several days  Feeling down, depressed, or hopeless Several days  Patient Health Questionnaire-2 Score 2  Over the past 2 weeks, how often have you been bothered by any of the following problems?  Trouble falling or staying asleep, or sleeping too much Not at all  Feeling tired or having little energy Not at all  Poor appetite or overeating Not at all  Feeling bad about yourself - or that you are a failure or have let yourself or your family down Not at all  Trouble concentrating on things,  such as reading the newspaper or watching television Not at all  Moving or speaking so slowly that other people could have noticed? Or the opposite - being so fidgety or restless that you have been moving around a lot more than usual. Not at all  Thoughts that you would be better off dead or hurting yourself in some way Not at all  Patient Health Questionnaire-9 Score 2   We discussed calling the postpartum office if her feeling down increases. If she feel she may hurt herself or others she agreed to call 911 or go to ED.   [facility] Care Transitions: Postpartum   Pain Specific to Delivery Type: Vaginal If you had a vaginal delivery, do you have pain in the perineal area (between vagina and anus)? Yes On a scale from 0 (no pain) to 10 (worst pain imaginable), how would you rate your current pain? 1.5 Is the pain constant, or does it come and go? No comes and goes Have you noticed increased pain compared to when you left the hospital? No Does anything make the pain better or worse? Worse-standing/sitting Are you able to move around, get out of bed, or care for your baby despite the pain? no Bleeding How would you describe your vaginal bleeding? moderate  Has the amount of bleeding decreased since discharge? yes Are you passing any large clots? none  Lactation Are you breastfeeding? yes Do you have any concerns about any of the following? Breast pain/soreness- discussed trying breast milk on nipple, and cold packs to breast 10-15 min ore/post feeding.  Are you bottle feeding? yes Do you have any concerns with the following? none Follow-up Appointments  Post-partum Follow-up Visit Is your post-partum OBGYN visit scheduled? yes You have your postpartum visit on ____10/2/25_______at _____3:20pm_______(date/time). Primary Care Follow-up Visit Do you have a primary care provider? yes Newborn Appointment Have you scheduled your newborn's first appointment? yes went today Are there any  barriers preventing you from keeping these scheduled appointments? No No BM yet, but feels like she can.  Taking colace as prescribed. Has number to call if issues [phone].   LEONA WASHINGTON     [address], [address]; [place], [place] [address] l  [email or web address] l [phone]   For more information on [facility] services, click here.
